# Patient Record
Sex: Female | Born: 1955
Health system: Southern US, Community
[De-identification: ages and names within clinical notes are randomized; demographics above are authoritative.]

## PROBLEM LIST (undated history)

## (undated) DIAGNOSIS — I10 Essential (primary) hypertension: Secondary | ICD-10-CM

## (undated) DIAGNOSIS — Z780 Asymptomatic menopausal state: Secondary | ICD-10-CM

## (undated) DIAGNOSIS — E785 Hyperlipidemia, unspecified: Secondary | ICD-10-CM

## (undated) DIAGNOSIS — M549 Dorsalgia, unspecified: Secondary | ICD-10-CM

## (undated) DIAGNOSIS — M810 Age-related osteoporosis without current pathological fracture: Secondary | ICD-10-CM

## (undated) DIAGNOSIS — F419 Anxiety disorder, unspecified: Secondary | ICD-10-CM

## (undated) HISTORY — DX: Hyperlipidemia, unspecified: E78.5

## (undated) HISTORY — DX: Age-related osteoporosis without current pathological fracture: M81.0

## (undated) HISTORY — DX: Dorsalgia, unspecified: M54.9

## (undated) HISTORY — DX: Asymptomatic menopausal state: Z78.0

## (undated) HISTORY — PX: POLYPECTOMY: SHX149

## (undated) HISTORY — DX: Anxiety disorder, unspecified: F41.9

## (undated) HISTORY — DX: Essential (primary) hypertension: I10

## (undated) HISTORY — PX: COLONOSCOPY: SHX174

---

## 2011-08-03 ENCOUNTER — Encounter: Payer: Self-pay | Admitting: Internal Medicine

## 2011-08-03 ENCOUNTER — Ambulatory Visit (INDEPENDENT_AMBULATORY_CARE_PROVIDER_SITE_OTHER): Payer: BC Managed Care – PPO | Admitting: Internal Medicine

## 2011-08-03 DIAGNOSIS — Z0000 Encounter for general adult medical examination without abnormal findings: Secondary | ICD-10-CM

## 2011-08-03 DIAGNOSIS — Z23 Encounter for immunization: Secondary | ICD-10-CM

## 2011-08-03 DIAGNOSIS — Z1231 Encounter for screening mammogram for malignant neoplasm of breast: Secondary | ICD-10-CM

## 2011-08-03 DIAGNOSIS — F419 Anxiety disorder, unspecified: Secondary | ICD-10-CM

## 2011-08-03 HISTORY — DX: Anxiety disorder, unspecified: F41.9

## 2011-08-03 LAB — CBC WITH DIFFERENTIAL/PLATELET
Basophils Absolute: 0 10*3/uL (ref 0.0–0.1)
Basophils Relative: 0.3 % (ref 0.0–3.0)
Eosinophils Absolute: 0.1 10*3/uL (ref 0.0–0.7)
Eosinophils Relative: 1.4 % (ref 0.0–5.0)
HCT: 32.2 % — ABNORMAL LOW (ref 36.0–46.0)
Hemoglobin: 10.9 g/dL — ABNORMAL LOW (ref 12.0–15.0)
Lymphocytes Relative: 32.5 % (ref 12.0–46.0)
Lymphs Abs: 2.5 10*3/uL (ref 0.7–4.0)
MCHC: 33.8 g/dL (ref 30.0–36.0)
MCV: 98.9 fl (ref 78.0–100.0)
Monocytes Absolute: 0.4 10*3/uL (ref 0.1–1.0)
Monocytes Relative: 5.4 % (ref 3.0–12.0)
Neutro Abs: 4.6 10*3/uL (ref 1.4–7.7)
Neutrophils Relative %: 60.4 % (ref 43.0–77.0)
Platelets: 195 10*3/uL (ref 150.0–400.0)
RBC: 3.26 Mil/uL — ABNORMAL LOW (ref 3.87–5.11)
RDW: 12.6 % (ref 11.5–14.6)
WBC: 7.7 10*3/uL (ref 4.5–10.5)

## 2011-08-03 LAB — LIPID PANEL
Cholesterol: 223 mg/dL — ABNORMAL HIGH (ref 0–200)
HDL: 59.3 mg/dL (ref >39.00)
Total CHOL/HDL Ratio: 4
Triglycerides: 267 mg/dL — ABNORMAL HIGH (ref 0.0–149.0)
VLDL: 53.4 mg/dL — ABNORMAL HIGH (ref 0.0–40.0)

## 2011-08-03 LAB — LDL CHOLESTEROL, DIRECT: Direct LDL: 120.4 mg/dL

## 2011-08-03 LAB — TSH: TSH: 1 u[IU]/mL (ref 0.35–5.50)

## 2011-08-03 MED ORDER — LISINOPRIL-HYDROCHLOROTHIAZIDE 10-12.5 MG PO TABS: 1.0000 | ORAL_TABLET | Freq: Every day | ORAL | Status: DC

## 2011-08-03 NOTE — Assessment & Plan Note (Signed)
Will call for Rf of tranxene when needed , uses on average 1 a day (AM)

## 2011-08-03 NOTE — Patient Instructions (Signed)
Came back for your female check within 6 months

## 2011-08-03 NOTE — Assessment & Plan Note (Addendum)
Previous PCP retired, Dr Javier Glazier (a gynecologist) Last Td >  10 years ago--- give one today Last MMG ~ 5 years ago--benefits explained, referral Last PAP ~ 3 years ago Declined female exam today: will reschedule Tobacco: counseled about risk, info provided about quitting , does see a dentist routinely Never had a cscope: Discussed colon ca screening, iFOB provided, benefits of Cscope discussed will call when ready Recommend a DEXA ("will think about that") Diet-exercise discussed

## 2011-08-03 NOTE — Progress Notes (Signed)
  Subjective:    Patient ID: Kimberly Valenzuela, female    DOB: October 24, 1956, 55 y.o.   MRN: [id number]  HPI New patient, CPX, feels well   Past Medical History  Diagnosis Date  . Hypertension   . Dyslipidemia     high TG , dx ~ 01-2011  . Menopause     after BCP were d/c ~ 2010  . Anxiety 08/03/2011   Past Surgical History  Procedure Date  . Cesarean section    History   Social History  . Marital Status: Married    Spouse Name: N/A    Number of Children: 1  . Years of Education: N/A   Occupational History  . sales     Social History Main Topics  . Smoking status: Current Everyday Smoker -- 0.3 packs/day  . Smokeless tobacco: Never Used  . Alcohol Use: Yes     socially   . Drug Use: No  . Sexually Active: Not on file   Other Topics Concern  . Not on file   Social History Narrative  . No narrative on file   Family History  Problem Relation Age of Onset  . Diabetes Neg Hx   . Coronary artery disease Neg Hx   . Stroke Neg Hx   . Colon cancer Neg Hx   . Breast cancer Neg Hx       Review of Systems  Respiratory: Negative for cough and wheezing.   Cardiovascular: Negative for chest pain and leg swelling.  Gastrointestinal: Negative for abdominal pain and blood in stool.  Genitourinary: Negative for hematuria, vaginal bleeding and difficulty urinating.       Objective:   Physical Exam  Constitutional: She is oriented to person, place, and time. She appears well-developed and well-nourished. No distress.  HENT:  Head: Normocephalic and atraumatic.  Neck: No thyromegaly present.  Cardiovascular: Normal rate, regular rhythm and normal heart sounds.   No murmur heard. Pulmonary/Chest: Effort normal and breath sounds normal. No respiratory distress. She has no wheezes. She has no rales.  Abdominal: Soft. Bowel sounds are normal. She exhibits no distension. There is no tenderness. There is no rebound and no guarding.  Musculoskeletal: She exhibits no edema.    Neurological: She is alert and oriented to person, place, and time.  Skin: Skin is warm and dry. She is not diaphoretic.  Psychiatric: She has a normal mood and affect. Her behavior is normal. Judgment and thought content normal.          Assessment & Plan:

## 2011-08-04 LAB — VITAMIN D 25 HYDROXY (VIT D DEFICIENCY, FRACTURES): Vit D, 25-Hydroxy: 50 ng/mL (ref 30–89)

## 2011-08-06 LAB — COMPREHENSIVE METABOLIC PANEL
ALT: 32 U/L (ref 0–35)
AST: 24 U/L (ref 0–37)
Albumin: 4.7 g/dL (ref 3.5–5.2)
Alkaline Phosphatase: 73 U/L (ref 39–117)
BUN: 21 mg/dL (ref 6–23)
CO2: 25 mEq/L (ref 19–32)
Calcium: 10 mg/dL (ref 8.4–10.5)
Chloride: 103 mEq/L (ref 96–112)
Creatinine, Ser: 1.2 mg/dL (ref 0.4–1.2)
GFR: 50.05 mL/min — ABNORMAL LOW (ref >60.00)
Glucose, Bld: 90 mg/dL (ref 70–99)
Potassium: 4.2 mEq/L (ref 3.5–5.1)
Sodium: 139 mEq/L (ref 135–145)
Total Bilirubin: 0.3 mg/dL (ref 0.3–1.2)
Total Protein: 7.4 g/dL (ref 6.0–8.3)

## 2011-08-07 NOTE — Progress Notes (Signed)
Quick Note:  Pt is aware. Pt states she will call GI herself. ______

## 2011-08-13 ENCOUNTER — Encounter: Payer: Self-pay | Admitting: *Deleted

## 2011-08-16 ENCOUNTER — Other Ambulatory Visit: Payer: Self-pay | Admitting: Internal Medicine

## 2011-08-16 ENCOUNTER — Other Ambulatory Visit: Payer: BC Managed Care – PPO

## 2011-08-16 DIAGNOSIS — Z0000 Encounter for general adult medical examination without abnormal findings: Secondary | ICD-10-CM

## 2011-08-16 LAB — FECAL OCCULT BLOOD, IMMUNOCHEMICAL: Fecal Occult Bld: NEGATIVE

## 2011-08-28 ENCOUNTER — Ambulatory Visit: Payer: BC Managed Care – PPO

## 2011-08-28 DIAGNOSIS — Z029 Encounter for administrative examinations, unspecified: Secondary | ICD-10-CM

## 2011-08-29 ENCOUNTER — Telehealth: Payer: Self-pay | Admitting: *Deleted

## 2011-08-29 NOTE — Telephone Encounter (Signed)
Called patient to inform that Wellness Program form had been faxed; phone states "all circuits busy at this time, please try your call again at a later time".

## 2012-01-09 ENCOUNTER — Telehealth: Payer: Self-pay | Admitting: Internal Medicine

## 2012-01-09 MED ORDER — CLORAZEPATE DIPOTASSIUM 7.5 MG PO TABS: 7.5000 mg | ORAL_TABLET | Freq: Three times a day (TID) | ORAL | Status: DC | PRN

## 2012-01-09 NOTE — Telephone Encounter (Signed)
Refill done.  

## 2012-01-09 NOTE — Telephone Encounter (Signed)
Refill: Clorazepate Dip 7.5. 1 q 6-8 hours.

## 2012-01-09 NOTE — Telephone Encounter (Signed)
Ok 90, no RF 

## 2012-01-09 NOTE — Telephone Encounter (Signed)
OK to refill

## 2012-03-10 ENCOUNTER — Ambulatory Visit (INDEPENDENT_AMBULATORY_CARE_PROVIDER_SITE_OTHER): Payer: BC Managed Care – PPO | Admitting: Internal Medicine

## 2012-03-10 ENCOUNTER — Encounter: Payer: Self-pay | Admitting: Internal Medicine

## 2012-03-10 ENCOUNTER — Other Ambulatory Visit (HOSPITAL_COMMUNITY)
Admission: RE | Admit: 2012-03-10 | Discharge: 2012-03-10 | Disposition: A | Discharge status: Home / Self Care | Payer: BC Managed Care – PPO | Source: Ambulatory Visit | Attending: Internal Medicine | Admitting: Internal Medicine

## 2012-03-10 VITALS — BP 90/62 | HR 80 | Temp 98.1°F | Wt 123.0 lb

## 2012-03-10 DIAGNOSIS — Z01419 Encounter for gynecological examination (general) (routine) without abnormal findings: Secondary | ICD-10-CM | POA: Insufficient documentation

## 2012-03-10 DIAGNOSIS — E785 Hyperlipidemia, unspecified: Secondary | ICD-10-CM | POA: Insufficient documentation

## 2012-03-10 DIAGNOSIS — Z0000 Encounter for general adult medical examination without abnormal findings: Secondary | ICD-10-CM

## 2012-03-10 DIAGNOSIS — Z8041 Family history of malignant neoplasm of ovary: Secondary | ICD-10-CM

## 2012-03-10 DIAGNOSIS — I10 Essential (primary) hypertension: Secondary | ICD-10-CM | POA: Insufficient documentation

## 2012-03-10 DIAGNOSIS — D649 Anemia, unspecified: Secondary | ICD-10-CM | POA: Insufficient documentation

## 2012-03-10 LAB — IRON: Iron: 71 ug/dL (ref 42–145)

## 2012-03-10 LAB — LIPID PANEL
Cholesterol: 184 mg/dL (ref 0–200)
HDL: 52.5 mg/dL (ref >39.00)
LDL Cholesterol: 93 mg/dL (ref 0–99)
Total CHOL/HDL Ratio: 4
Triglycerides: 191 mg/dL — ABNORMAL HIGH (ref 0.0–149.0)
VLDL: 38.2 mg/dL (ref 0.0–40.0)

## 2012-03-10 LAB — HEMOGLOBIN: Hemoglobin: 11.5 g/dL — ABNORMAL LOW (ref 12.0–15.0)

## 2012-03-10 MED ORDER — CLORAZEPATE DIPOTASSIUM 7.5 MG PO TABS: 7.5000 mg | ORAL_TABLET | Freq: Three times a day (TID) | ORAL | Status: DC | PRN

## 2012-03-10 NOTE — Assessment & Plan Note (Addendum)
Last MMG ~ 5 years ago--- MMG ordered today Patient is on her menopause, she took birth control pills up until age 56 Last PAP ~ 3 years ago, PAP sent today Never had a cscope--- Hemoccult positive today. Will send to GI. Patient has a family history of ovarian cancer, grandmother and mother. Will refer to genetic counseling at the oncology center. C  125? Patient concerned about the cost. Recommend to discuss with her insurance.

## 2012-03-10 NOTE — Assessment & Plan Note (Signed)
Repeat a cholesterol panel.

## 2012-03-10 NOTE — Assessment & Plan Note (Signed)
BP today slightly low, she is asymptomatic, no ambulatory BPs. Plan: Check ambulatory BPs, see instructions

## 2012-03-10 NOTE — Progress Notes (Signed)
  Subjective:    Patient ID: Kimberly Valenzuela, female    DOB: 1956/04/19, 56 y.o.   MRN: [id number]  HPI ROV, needs a PAP Today, the patient reports that both her grandmother and mother had been diagnosed with ovarian cancer. Patient is concerned  Past Medical History  Diagnosis Date  . Hypertension   . Dyslipidemia     high TG , dx ~ 01-2011  . Menopause     after BCP were d/c ~ 2010  . Anxiety 08/03/2011   Past Surgical History  Procedure Date  . Cesarean section        Review of Systems Good medication compliance, no ambulatory BPs. BP today 90/62, she is asymptomatic. She noted some weight loss lately, thinks related to worries, her mother is ill. She is taking care of her stepfather. Denies any vaginal discharge or vaginal bleeding. Does self breast exam rarely.     Objective:   Physical Exam General -- alert, well-developed, and well-nourished.   Abdomen--soft, non-tender, no distention, no masses, no HSM, no guarding, and no rigidity.   Extremities-- no pretibial edema bilaterally Rectal-- external hemorrhoids noted. Normal sphincter tone. No rectal masses or tenderness. Brown stool, Hemoccult + Vaginal exam-- normal external exam, normal cervix w/o lesions or d/c, bimanual w/o mass Neurologic-- alert & oriented X3 and strength normal in all extremities. Psych-- Cognition and judgment appear intact. Alert and cooperative with normal attention span and concentration.  not anxious appearing and not depressed appearing.       Assessment & Plan:  Today , I spent more than 25 min with the patient, >50% of the time counseling

## 2012-03-10 NOTE — Patient Instructions (Signed)
I will refer you to a genetic counselor and a hematology oncology group The test that you potentially need is a CA 125. Check the  blood pressure 2 or 3 times a week, be sure it is less than 140/85 and more than 105/65. If it is consistently higher or lower , let me know

## 2012-03-11 LAB — FERRITIN: Ferritin: 38.5 ng/mL (ref 10.0–291.0)

## 2012-03-11 LAB — VITAMIN B12: Vitamin B-12: 511 pg/mL (ref 211–911)

## 2012-03-11 LAB — FOLATE: Folate: 16.2 ng/mL (ref >5.9)

## 2012-03-12 ENCOUNTER — Encounter: Payer: Self-pay | Admitting: Gastroenterology

## 2012-03-13 ENCOUNTER — Encounter: Payer: Self-pay | Admitting: *Deleted

## 2012-03-14 ENCOUNTER — Telehealth: Payer: Self-pay | Admitting: Genetic Counselor

## 2012-03-14 NOTE — Telephone Encounter (Signed)
S/w pt today re genetics appt. Per pt she does not wish to schedule right. Per pt she has a lot on her plate right now and since her test have come back fine she will hold off for now and call us or dr Drue Novel if she wants to schedule appt. Referral from dr Drue Novel.

## 2012-03-21 ENCOUNTER — Ambulatory Visit
Admission: RE | Admit: 2012-03-21 | Discharge: 2012-03-21 | Disposition: A | Discharge status: Home / Self Care | Payer: BC Managed Care – PPO | Source: Ambulatory Visit | Attending: Internal Medicine | Admitting: Internal Medicine

## 2012-03-21 DIAGNOSIS — Z1231 Encounter for screening mammogram for malignant neoplasm of breast: Secondary | ICD-10-CM

## 2012-03-25 ENCOUNTER — Other Ambulatory Visit: Payer: Self-pay | Admitting: Internal Medicine

## 2012-03-25 DIAGNOSIS — R928 Other abnormal and inconclusive findings on diagnostic imaging of breast: Secondary | ICD-10-CM

## 2012-03-26 ENCOUNTER — Telehealth: Payer: Self-pay | Admitting: Genetic Counselor

## 2012-03-26 NOTE — Telephone Encounter (Signed)
lmonvm for pt re calling me for genetics appt. 

## 2012-04-01 ENCOUNTER — Ambulatory Visit
Admission: RE | Admit: 2012-04-01 | Discharge: 2012-04-01 | Disposition: A | Discharge status: Home / Self Care | Payer: BC Managed Care – PPO | Source: Ambulatory Visit | Attending: Internal Medicine | Admitting: Internal Medicine

## 2012-04-01 DIAGNOSIS — R928 Other abnormal and inconclusive findings on diagnostic imaging of breast: Secondary | ICD-10-CM

## 2012-04-08 ENCOUNTER — Ambulatory Visit (AMBULATORY_SURGERY_CENTER): Payer: BC Managed Care – PPO | Admitting: *Deleted

## 2012-04-08 VITALS — Ht 63.0 in | Wt 120.0 lb

## 2012-04-08 DIAGNOSIS — Z1211 Encounter for screening for malignant neoplasm of colon: Secondary | ICD-10-CM

## 2012-04-08 MED ORDER — PEG-KCL-NACL-NASULF-NA ASC-C 100 G PO SOLR: ORAL | Status: DC

## 2012-04-09 ENCOUNTER — Encounter: Payer: Self-pay | Admitting: Internal Medicine

## 2012-04-22 ENCOUNTER — Telehealth: Payer: Self-pay | Admitting: Genetic Counselor

## 2012-04-22 NOTE — Telephone Encounter (Signed)
Per staff message from [facility]) cx referral for genetics.

## 2012-04-23 ENCOUNTER — Other Ambulatory Visit: Payer: BC Managed Care – PPO | Admitting: Gastroenterology

## 2012-04-25 ENCOUNTER — Encounter: Payer: BC Managed Care – PPO | Admitting: Gastroenterology

## 2012-05-16 ENCOUNTER — Encounter: Payer: Self-pay | Admitting: Internal Medicine

## 2012-05-16 ENCOUNTER — Ambulatory Visit (AMBULATORY_SURGERY_CENTER): Payer: BC Managed Care – PPO | Admitting: Internal Medicine

## 2012-05-16 VITALS — BP 88/61 | HR 66 | Temp 99.3°F | Resp 11 | Ht 63.0 in | Wt 120.0 lb

## 2012-05-16 DIAGNOSIS — D126 Benign neoplasm of colon, unspecified: Secondary | ICD-10-CM

## 2012-05-16 DIAGNOSIS — Z1211 Encounter for screening for malignant neoplasm of colon: Secondary | ICD-10-CM

## 2012-05-16 MED ORDER — SODIUM CHLORIDE 0.9 % IV SOLN: 500.0000 mL | INTRAVENOUS | Status: DC

## 2012-05-16 NOTE — Op Note (Signed)
 [facility] [address] [place], [place]  [address]  COLONOSCOPY PROCEDURE REPORT  PATIENT:  Kimberly Valenzuela, Kimberly Valenzuela  MR#:  [id number] BIRTHDATE:  [birth year], 55 yrs. old  GENDER:  female ENDOSCOPIST:  Iva Boop, MD, [facility] REF. BY:  Willow Ora, M.D. PROCEDURE DATE:  05/16/2012 PROCEDURE:  Colonoscopy with snare polypectomy ASA CLASS:  Class II INDICATIONS:  Routine Risk Screening MEDICATIONS:   MAC sedation, administered by CRNA, propofol (Diprivan) 200 mg IV  DESCRIPTION OF PROCEDURE:   After the risks benefits and alternatives of the procedure were thoroughly explained, informed consent was obtained.  Digital rectal exam was performed and revealed no abnormalities.   The LB CF-[id number] endoscope was introduced through the anus and advanced to the cecum, which was identified by both the appendix and ileocecal valve, without limitations.  The quality of the prep was excellent, using MoviPrep.  The instrument was then slowly withdrawn as the colon was fully examined. <<PROCEDUREIMAGES>>  FINDINGS:  A diminutive polyp was found in the sigmoid colon. Polyp was snared without cautery. Retrieval was successful. This was otherwise a normal examination of the colon. Includes right colon retroflexion.   Retroflexed views in the rectum revealed no abnormalities.    The time to cecum = 2:43 minutes. The scope was then carefully withdrawn in 7:49 minutes from the cecum and the procedure completed. COMPLICATIONS:  None ENDOSCOPIC IMPRESSION: 1) Diminutive polyp in the sigmoid colon - removed 2) Otherwise normal examination  REPEAT EXAM:  In for Colonoscopy, pending biopsy results.  Iva Boop, MD, [facility]  CC:  The Patient and Willow Ora, MD  n. eSIGNED:   Iva Boop at 05/16/2012 12:10 PM  [place], [place], [phone]

## 2012-05-16 NOTE — Progress Notes (Signed)
Patient did not experience any of the following events: a burn prior to discharge; a fall within the facility; wrong site/side/patient/procedure/implant event; or a hospital transfer or hospital admission upon discharge from the facility. (G8907) Patient did not have preoperative order for IV antibiotic SSI prophylaxis. (G8918)  

## 2012-05-16 NOTE — Patient Instructions (Addendum)
One small polyp was removed during this screening colonoscopy. It looks benign. I will send you a letter about the results and recommendations for future screening. Iva Boop, MD, FACG  YOU HAD AN ENDOSCOPIC PROCEDURE TODAY AT THE [facility]: Refer to the procedure report that was given to you for any specific questions about what was found during the examination.  If the procedure report does not answer your questions, please call your gastroenterologist to clarify.  If you requested that your care partner not be given the details of your procedure findings, then the procedure report has been included in a sealed envelope for you to review at your convenience later.  YOU SHOULD EXPECT: Some feelings of bloating in the abdomen. Passage of more gas than usual.  Walking can help get rid of the air that was put into your GI tract during the procedure and reduce the bloating. If you had a lower endoscopy (such as a colonoscopy or flexible sigmoidoscopy) you may notice spotting of blood in your stool or on the toilet paper. If you underwent a bowel prep for your procedure, then you may not have a normal bowel movement for a few days.  DIET: Your first meal following the procedure should be a light meal and then it is ok to progress to your normal diet.  A half-sandwich or bowl of soup is an example of a good first meal.  Heavy or fried foods are harder to digest and may make you feel nauseous or bloated.  Likewise meals heavy in dairy and vegetables can cause extra gas to form and this can also increase the bloating.  Drink plenty of fluids but you should avoid alcoholic beverages for 24 hours.  ACTIVITY: Your care partner should take you home directly after the procedure.  You should plan to take it easy, moving slowly for the rest of the day.  You can resume normal activity the day after the procedure however you should NOT DRIVE or use heavy machinery for 24 hours (because of the sedation  medicines used during the test).    SYMPTOMS TO REPORT IMMEDIATELY: A gastroenterologist can be reached at any hour.  During normal business hours, 8:30 AM to 5:00 PM Monday through Friday, call [phone].  After hours and on weekends, please call the GI answering service at [phone] who will take a message and have the physician on call contact you.   Following lower endoscopy (colonoscopy or flexible sigmoidoscopy):  Excessive amounts of blood in the stool  Significant tenderness or worsening of abdominal pains  Swelling of the abdomen that is new, acute  Fever of 100F or higher  Following upper endoscopy (EGD)  Vomiting of blood or coffee ground material  New chest pain or pain under the shoulder blades  Painful or persistently difficult swallowing  New shortness of breath  Fever of 100F or higher  Black, tarry-looking stools  FOLLOW UP: If any biopsies were taken you will be contacted by phone or by letter within the next 1-3 weeks.  Call your gastroenterologist if you have not heard about the biopsies in 3 weeks.  Our staff will call the home number listed on your records the next business day following your procedure to check on you and address any questions or concerns that you may have at that time regarding the information given to you following your procedure. This is a courtesy call and so if there is no answer at the home number and  we have not heard from you through the emergency physician on call, we will assume that you have returned to your regular daily activities without incident.  SIGNATURES/CONFIDENTIALITY: You and/or your care partner have signed paperwork which will be entered into your electronic medical record.  These signatures attest to the fact that that the information above on your After Visit Summary has been reviewed and is understood.  Full responsibility of the confidentiality of this discharge information lies with you and/or your care-partner.     Handout on polyps

## 2012-05-19 ENCOUNTER — Telehealth: Payer: Self-pay | Admitting: *Deleted

## 2012-05-19 NOTE — Telephone Encounter (Signed)
  Follow up Call-  Call back number 05/16/2012  Post procedure Call Back phone  # [phone]  Permission to leave phone message Yes     Patient questions:  Do you have a fever, pain , or abdominal swelling? no Pain Score  0 *  Have you tolerated food without any problems? yes  Have you been able to return to your normal activities? yes  Do you have any questions about your discharge instructions: Diet   no Medications  no Follow up visit  no  Do you have questions or concerns about your Care? no  Actions: * If pain score is 4 or above: No action needed, pain <4.

## 2012-05-21 ENCOUNTER — Encounter: Payer: Self-pay | Admitting: Internal Medicine

## 2012-05-21 DIAGNOSIS — Z860100 Personal history of colon polyps, unspecified: Secondary | ICD-10-CM | POA: Insufficient documentation

## 2012-05-21 NOTE — Progress Notes (Signed)
Quick Note:  Diminutive sigmoid sessile serrated adenoma - routine repeat 5 years about 2018 ______

## 2012-06-09 ENCOUNTER — Ambulatory Visit: Payer: BC Managed Care – PPO | Admitting: Internal Medicine

## 2012-08-22 ENCOUNTER — Ambulatory Visit (INDEPENDENT_AMBULATORY_CARE_PROVIDER_SITE_OTHER): Payer: BC Managed Care – PPO | Admitting: Internal Medicine

## 2012-08-22 ENCOUNTER — Encounter: Payer: Self-pay | Admitting: Internal Medicine

## 2012-08-22 VITALS — BP 112/74 | HR 77 | Temp 98.2°F | Ht 62.5 in | Wt 117.0 lb

## 2012-08-22 DIAGNOSIS — I10 Essential (primary) hypertension: Secondary | ICD-10-CM

## 2012-08-22 DIAGNOSIS — F419 Anxiety disorder, unspecified: Secondary | ICD-10-CM

## 2012-08-22 DIAGNOSIS — E785 Hyperlipidemia, unspecified: Secondary | ICD-10-CM

## 2012-08-22 DIAGNOSIS — Z780 Asymptomatic menopausal state: Secondary | ICD-10-CM

## 2012-08-22 DIAGNOSIS — Z0000 Encounter for general adult medical examination without abnormal findings: Secondary | ICD-10-CM

## 2012-08-22 LAB — COMPREHENSIVE METABOLIC PANEL
ALT: 35 U/L (ref 0–35)
AST: 32 U/L (ref 0–37)
Albumin: 4.2 g/dL (ref 3.5–5.2)
Alkaline Phosphatase: 75 U/L (ref 39–117)
BUN: 23 mg/dL (ref 6–23)
CO2: 25 mEq/L (ref 19–32)
Calcium: 9.3 mg/dL (ref 8.4–10.5)
Chloride: 107 mEq/L (ref 96–112)
Creatinine, Ser: 1.1 mg/dL (ref 0.4–1.2)
GFR: 52.39 mL/min — ABNORMAL LOW (ref >60.00)
Glucose, Bld: 93 mg/dL (ref 70–99)
Potassium: 4.2 mEq/L (ref 3.5–5.1)
Sodium: 138 mEq/L (ref 135–145)
Total Bilirubin: 0.5 mg/dL (ref 0.3–1.2)
Total Protein: 6.7 g/dL (ref 6.0–8.3)

## 2012-08-22 LAB — CBC WITH DIFFERENTIAL/PLATELET
Basophils Absolute: 0 10*3/uL (ref 0.0–0.1)
Basophils Relative: 0.4 % (ref 0.0–3.0)
Eosinophils Absolute: 0.1 10*3/uL (ref 0.0–0.7)
Eosinophils Relative: 2 % (ref 0.0–5.0)
HCT: 33.3 % — ABNORMAL LOW (ref 36.0–46.0)
Hemoglobin: 11.3 g/dL — ABNORMAL LOW (ref 12.0–15.0)
Lymphocytes Relative: 29.4 % (ref 12.0–46.0)
Lymphs Abs: 1.9 10*3/uL (ref 0.7–4.0)
MCHC: 33.9 g/dL (ref 30.0–36.0)
MCV: 97.8 fl (ref 78.0–100.0)
Monocytes Absolute: 0.3 10*3/uL (ref 0.1–1.0)
Monocytes Relative: 4.4 % (ref 3.0–12.0)
Neutro Abs: 4.2 10*3/uL (ref 1.4–7.7)
Neutrophils Relative %: 63.8 % (ref 43.0–77.0)
Platelets: 234 10*3/uL (ref 150.0–400.0)
RBC: 3.41 Mil/uL — ABNORMAL LOW (ref 3.87–5.11)
RDW: 12.4 % (ref 11.5–14.6)
WBC: 6.5 10*3/uL (ref 4.5–10.5)

## 2012-08-22 LAB — FERRITIN: Ferritin: 50.8 ng/mL (ref 10.0–291.0)

## 2012-08-22 LAB — LIPID PANEL
Cholesterol: 165 mg/dL (ref 0–200)
HDL: 61.2 mg/dL (ref >39.00)
LDL Cholesterol: 83 mg/dL (ref 0–99)
Total CHOL/HDL Ratio: 3
Triglycerides: 102 mg/dL (ref 0.0–149.0)
VLDL: 20.4 mg/dL (ref 0.0–40.0)

## 2012-08-22 LAB — IRON: Iron: 76 ug/dL (ref 42–145)

## 2012-08-22 LAB — VITAMIN B12: Vitamin B-12: 340 pg/mL (ref 211–911)

## 2012-08-22 MED ORDER — LISINOPRIL-HYDROCHLOROTHIAZIDE 10-12.5 MG PO TABS: 1.0000 | ORAL_TABLET | Freq: Every day | ORAL | Status: DC

## 2012-08-22 MED ORDER — CLORAZEPATE DIPOTASSIUM 7.5 MG PO TABS: 7.5000 mg | ORAL_TABLET | Freq: Three times a day (TID) | ORAL | Status: DC | PRN

## 2012-08-22 NOTE — Assessment & Plan Note (Addendum)
Previous PCP retired, Dr Javier Glazier (a gynecologist) Last Td 2012 declined a flu shot , benefits explained  MMG 9-12 abnormal f/u by a normal R   u/s 03-2012 which was normal, they recommended a screening MMG 03-2013. Encouraged SBE Last PAP 02-2012, h/o , of a remote, single abnormal PAP, multiple normal paps since. Plan-- pap next year Family history of ovarian ca x 2, pt decided not to pursue genetic counseling Cscope Dr Leone Payor [phone], had a polyp, next in 5 years Tobacco: counseled about risk, does see a dentist routinely, E cigaret ? I'm not opposed Recommend a DEXA , will schedule, rec ca and vit d Diet-exercise discussed

## 2012-08-22 NOTE — Assessment & Plan Note (Signed)
Lost her father recently, mother has cancer, she has some anxiety but managing well with clorazepate one in the morning and occasionally an additional one in the afternoon.

## 2012-08-22 NOTE — Assessment & Plan Note (Signed)
good medication compliance, BP one time was 87/47 she did not feel particularly weak. I wonder if that was accurate. Her readings are within normal otherwise. Plan: No change

## 2012-08-22 NOTE — Progress Notes (Signed)
  Subjective:    Patient ID: Kimberly Valenzuela, female    DOB: [birth year], 56 y.o.   MRN: [id number]  HPI CPX  Past medical history Hypertension Anxiety Dyslipidemia, hypertriglyceridemia DX 01-2011 Menopause, after BCP  were discontinued 2010  Past surgical history C-section  History   Social History  . Marital Status: Married    Spouse Name: N/A    Number of Children: 1  . Years of Education: N/A   Occupational History  . sales     Social History Main Topics  . Smoking status: Current Every Day Smoker -- 0.5 packs/day    Types: Cigarettes  . Smokeless tobacco: Never Used  . Alcohol Use: 0.0 oz/week     socially , wine  . Drug Use: 2 per week    Special: Marijuana  . Sexually Active: Not on file   Other Topics Concern  . Not on file   Social History Narrative  . No narrative on file   Family History  Problem Relation Age of Onset  . Diabetes Neg Hx   . Coronary artery disease Neg Hx   . Stroke Neg Hx   . Breast cancer Neg Hx   . Ovarian cancer Mother   . Ovarian cancer Maternal Grandmother   . Colon cancer Maternal Grandfather     Review of Systems In general doing well, see assessment and plan Denies chest pain, shortness of breath. Very seldom has cough. No nausea, vomiting, diarrhea, no blood in the stools. Denies any vaginal discharge or vaginal bleeding.     Objective:   Physical Exam General -- alert, well-developed, and well-nourished.   Neck --no thyromegaly , normal carotid pulse Lungs -- normal respiratory effort, no intercostal retractions, no accessory muscle use, and normal breath sounds.   Heart-- normal rate, regular rhythm, no murmur, and no gallop.   Abdomen--soft, non-tender, no distention, no masses, no HSM, no guarding, and no rigidity.   Extremities-- no pretibial edema bilaterally  Neurologic-- alert & oriented X3 and strength normal in all extremities. Psych-- Cognition and judgment appear intact. Alert and cooperative with normal  attention span and concentration.  not anxious appearing and not depressed appearing.       Assessment & Plan:

## 2012-08-22 NOTE — Assessment & Plan Note (Signed)
Not taking fish oil daily. Recommend to take at least 1 a day

## 2012-08-26 ENCOUNTER — Encounter: Payer: Self-pay | Admitting: *Deleted

## 2012-08-26 NOTE — Addendum Note (Signed)
Addended by: Edwena Felty T on: 08/26/2012 03:05 PM   Modules accepted: Orders

## 2012-08-27 ENCOUNTER — Telehealth: Payer: Self-pay

## 2012-08-27 NOTE — Telephone Encounter (Signed)
Spoke with pt advising lab results:  Kimberly Valenzuela, Your calcium, potassium, kidney, liver tests and B12 are normal Your cholesterol is very good You have a very mild anemia but the iron is normal. Good results, continue with use the same medications.   Also advised pt [name] mailed the result. Pt stated understanding.      MW

## 2012-10-03 ENCOUNTER — Ambulatory Visit (INDEPENDENT_AMBULATORY_CARE_PROVIDER_SITE_OTHER)
Admission: RE | Admit: 2012-10-03 | Discharge: 2012-10-03 | Disposition: A | Discharge status: Home / Self Care | Payer: BC Managed Care – PPO | Source: Ambulatory Visit | Attending: Internal Medicine | Admitting: Internal Medicine

## 2012-10-03 DIAGNOSIS — Z780 Asymptomatic menopausal state: Secondary | ICD-10-CM

## 2012-10-12 ENCOUNTER — Telehealth: Payer: Self-pay | Admitting: Internal Medicine

## 2012-10-12 NOTE — Telephone Encounter (Signed)
Advise patient, DEXA--> osteoporosis. In addition to daily calcium, vitamin D and stay active I recommend a medication. She could try Fosamax weekly or Actonel monthly. Let me know her preference. We can discuss further at an office visit if needed

## 2012-10-14 NOTE — Telephone Encounter (Signed)
Called pt, unable to leave a msg.   

## 2012-10-17 ENCOUNTER — Encounter: Payer: Self-pay | Admitting: *Deleted

## 2012-10-17 NOTE — Telephone Encounter (Signed)
Called pt, unable to leave a msg. Mailed letter.

## 2012-10-22 ENCOUNTER — Telehealth: Payer: Self-pay | Admitting: Internal Medicine

## 2012-10-22 NOTE — Telephone Encounter (Signed)
Pt would like to know if there is something else she can take other than fosamax & actonel. Please advise.

## 2012-10-22 NOTE — Telephone Encounter (Signed)
Yes, there is a number of other medicines she could take. Please arrange an office visit to discuss

## 2012-10-22 NOTE — Telephone Encounter (Signed)
Discussed with pt. Scheduled appt 12.16.13 @ 345p.

## 2012-10-22 NOTE — Telephone Encounter (Signed)
Patient received bmd results and would like to know if there is another alternative to the medications listed. Please call pt back at [phone]

## 2012-11-03 ENCOUNTER — Ambulatory Visit (INDEPENDENT_AMBULATORY_CARE_PROVIDER_SITE_OTHER): Payer: BC Managed Care – PPO | Admitting: Internal Medicine

## 2012-11-03 VITALS — BP 104/68 | HR 64 | Temp 98.0°F | Wt 124.0 lb

## 2012-11-03 DIAGNOSIS — Z0000 Encounter for general adult medical examination without abnormal findings: Secondary | ICD-10-CM

## 2012-11-13 NOTE — Progress Notes (Signed)
  Subjective:    Patient ID: Kimberly Valenzuela, female    DOB: 07-11-1956, 56 y.o.   MRN: [id number]  HPI Left without being seen, we were running late, stated she will reschedule   Review of Systems     Objective:   Physical Exam        Assessment & Plan:

## 2013-02-11 ENCOUNTER — Telehealth: Payer: Self-pay | Admitting: Internal Medicine

## 2013-02-12 NOTE — Telephone Encounter (Signed)
Left detailed msg on pt's vmail to return call & schedule OV.

## 2013-02-12 NOTE — Telephone Encounter (Signed)
Call pt, let her know I refilled #90, arrange a OV please

## 2013-02-12 NOTE — Telephone Encounter (Signed)
Ok to refill? Last OV 12.16.13 Last filled 10.4.13

## 2013-04-29 ENCOUNTER — Telehealth: Payer: Self-pay | Admitting: Internal Medicine

## 2013-04-29 NOTE — Telephone Encounter (Signed)
What lab orders need to be entered? Please advise.

## 2013-04-29 NOTE — Telephone Encounter (Signed)
Patient is calling to inquire about if she is due for labs in order to continue her Clorazepate medication.

## 2013-04-30 NOTE — Telephone Encounter (Signed)
She is due for a ROV, please arrange. I don't see a UDS, she needs one

## 2013-04-30 NOTE — Telephone Encounter (Signed)
Spoke with pt, scheduled appt 6.16.14.

## 2013-05-01 ENCOUNTER — Encounter: Payer: Self-pay | Admitting: Lab

## 2013-05-04 ENCOUNTER — Encounter: Payer: Self-pay | Admitting: Internal Medicine

## 2013-05-04 ENCOUNTER — Ambulatory Visit (INDEPENDENT_AMBULATORY_CARE_PROVIDER_SITE_OTHER): Payer: BC Managed Care – PPO | Admitting: Internal Medicine

## 2013-05-04 VITALS — BP 110/74 | HR 68 | Temp 98.3°F | Wt 120.0 lb

## 2013-05-04 DIAGNOSIS — M810 Age-related osteoporosis without current pathological fracture: Secondary | ICD-10-CM | POA: Insufficient documentation

## 2013-05-04 DIAGNOSIS — F419 Anxiety disorder, unspecified: Secondary | ICD-10-CM

## 2013-05-04 DIAGNOSIS — I10 Essential (primary) hypertension: Secondary | ICD-10-CM

## 2013-05-04 DIAGNOSIS — Z0000 Encounter for general adult medical examination without abnormal findings: Secondary | ICD-10-CM

## 2013-05-04 LAB — LIPID PANEL
Cholesterol: 194 mg/dL (ref 0–200)
HDL: 63.8 mg/dL (ref >39.00)
Total CHOL/HDL Ratio: 3
Triglycerides: 221 mg/dL — ABNORMAL HIGH (ref 0.0–149.0)
VLDL: 44.2 mg/dL — ABNORMAL HIGH (ref 0.0–40.0)

## 2013-05-04 LAB — COMPREHENSIVE METABOLIC PANEL
ALT: 44 U/L — ABNORMAL HIGH (ref 0–35)
AST: 35 U/L (ref 0–37)
Albumin: 4.3 g/dL (ref 3.5–5.2)
Alkaline Phosphatase: 63 U/L (ref 39–117)
BUN: 29 mg/dL — ABNORMAL HIGH (ref 6–23)
CO2: 24 mEq/L (ref 19–32)
Calcium: 9.4 mg/dL (ref 8.4–10.5)
Chloride: 106 mEq/L (ref 96–112)
Creatinine, Ser: 1.4 mg/dL — ABNORMAL HIGH (ref 0.4–1.2)
GFR: 43 mL/min — ABNORMAL LOW (ref >60.00)
Glucose, Bld: 81 mg/dL (ref 70–99)
Potassium: 4 mEq/L (ref 3.5–5.1)
Sodium: 140 mEq/L (ref 135–145)
Total Bilirubin: 0.5 mg/dL (ref 0.3–1.2)
Total Protein: 6.9 g/dL (ref 6.0–8.3)

## 2013-05-04 LAB — CBC WITH DIFFERENTIAL/PLATELET
Basophils Absolute: 0 10*3/uL (ref 0.0–0.1)
Basophils Relative: 0.4 % (ref 0.0–3.0)
Eosinophils Absolute: 0.1 10*3/uL (ref 0.0–0.7)
Eosinophils Relative: 1.8 % (ref 0.0–5.0)
HCT: 34.7 % — ABNORMAL LOW (ref 36.0–46.0)
Hemoglobin: 12 g/dL (ref 12.0–15.0)
Lymphocytes Relative: 32.9 % (ref 12.0–46.0)
Lymphs Abs: 2.4 10*3/uL (ref 0.7–4.0)
MCHC: 34.8 g/dL (ref 30.0–36.0)
MCV: 97.9 fl (ref 78.0–100.0)
Monocytes Absolute: 0.4 10*3/uL (ref 0.1–1.0)
Monocytes Relative: 4.9 % (ref 3.0–12.0)
Neutro Abs: 4.4 10*3/uL (ref 1.4–7.7)
Neutrophils Relative %: 60 % (ref 43.0–77.0)
Platelets: 223 10*3/uL (ref 150.0–400.0)
RBC: 3.54 Mil/uL — ABNORMAL LOW (ref 3.87–5.11)
RDW: 13 % (ref 11.5–14.6)
WBC: 7.3 10*3/uL (ref 4.5–10.5)

## 2013-05-04 LAB — LDL CHOLESTEROL, DIRECT: Direct LDL: 99.2 mg/dL

## 2013-05-04 LAB — TSH: TSH: 0.99 u[IU]/mL (ref 0.35–5.50)

## 2013-05-04 MED ORDER — CLORAZEPATE DIPOTASSIUM 7.5 MG PO TABS: ORAL_TABLET | ORAL | Status: DC

## 2013-05-04 NOTE — Assessment & Plan Note (Addendum)
Tdap 2012 Zostavax discussed  Cscope 04-2012--->  Polyps, 5 years  Last MMG 03-2012, abnormal on the R , f/u u/s R was ok, next was recommended in 1 year, will schedule (declined, likes to wait till next year) Breast exam today (-) H/o abnormal PAP x 1 in the 90s, f/u by normal ones, PAP 2013 (-) next ~2015 Patient has a family history of ovarian cancer, grandmother and mother. Will refer to genetic counseling at the oncology center. Diet-exercise discussed

## 2013-05-04 NOTE — Assessment & Plan Note (Addendum)
See ROS, going through a difficult time, counseled briefly today, recommend to see Raynelle Fanning. Continue Tranxene, discussed SSRIs

## 2013-05-04 NOTE — Assessment & Plan Note (Signed)
No change, labs  

## 2013-05-04 NOTE — Progress Notes (Signed)
  Subjective:    Patient ID: Kimberly Valenzuela, female    DOB: May 24, 1956, 57 y.o.   MRN: [id number]  HPI CPX  Past Medical History  Diagnosis Date  . Hypertension   . Dyslipidemia     high TG , dx ~ 01-2011  . Menopause     after BCP were d/c ~ 2010  . Anxiety 08/03/2011   Past Surgical History  Procedure Laterality Date  . Cesarean section     History   Social History  . Marital Status: Married    Spouse Name: N/A    Number of Children: 1  . Years of Education: N/A   Occupational History  . sales     Social History Main Topics  . Smoking status: Current Every Day Smoker -- 0.50 packs/day    Types: Cigarettes  . Smokeless tobacco: Never Used  . Alcohol Use: 0.0 oz/week     Comment: socially , wine  . Drug Use: 2.00 per week    Special: Marijuana  . Sexually Active: Not on file   Other Topics Concern  . Not on file   Social History Narrative   Lost parents 2014   Family History  Problem Relation Age of Onset  . Diabetes Neg Hx   . Coronary artery disease Neg Hx   . Stroke Neg Hx   . Breast cancer Neg Hx   . Ovarian cancer Mother   . Ovarian cancer Maternal Grandmother   . Colon cancer Maternal Grandfather    Review of Systems Diet regular, active but no routine exercise. Had a difficult year, lost both parents, husbaund lost his job, he abuses alcohol. ++Anxiety and mild depression on-off , Tranxene when necessary helps. Self breast exam normal. Denies any blood in the stools. No vaginal discharge or vaginal bleeding.     Objective:   Physical Exam BP 110/74  Pulse 68  Temp(Src) 98.3 F (36.8 C) (Oral)  Wt 120 lb (54.432 kg)  BMI 21.59 kg/m2  SpO2 99%  General -- alert, well-developed, NAD.   Neck --no thyromegaly , normal carotid pulse  Breasts-- quite nodular, no dominant mass; no thickening, tenderness, bulging, retraction, inflamation, nipple discharge or skin changes noted.  no axillary lymph nodes Lungs -- normal respiratory effort, no  intercostal retractions, no accessory muscle use, and normal breath sounds.   Heart-- normal rate, regular rhythm, no murmur, and no gallop.   Abdomen--soft, non-tender, no distention, no masses, no HSM, no guarding, and no rigidity.   Extremities-- no pretibial edema bilaterally  Neurologic-- alert & oriented X3 and strength normal in all extremities. Psych-- Cognition and judgment appear intact. Alert and cooperative with normal attention span and concentration.  not anxious appearing and not depressed appearing.       Assessment & Plan:

## 2013-05-04 NOTE — Assessment & Plan Note (Addendum)
Bone density test -- 2013 show T score of -2.8. His normal vitamin D. Risk factors discussed. In addition to calcium and vitamin D I recommend medication : oral-IV biphosphonates, Forteo, Prolia, HRT, etc. Elected IV, will set up Reclast

## 2013-05-04 NOTE — Patient Instructions (Addendum)
Next visit in 3 months. Please make an appointment with a counselor, consider Raynelle Fanning a counselor in our office.

## 2013-05-08 ENCOUNTER — Telehealth: Payer: Self-pay | Admitting: Internal Medicine

## 2013-05-08 ENCOUNTER — Telehealth: Payer: Self-pay | Admitting: Genetic Counselor

## 2013-05-08 DIAGNOSIS — I10 Essential (primary) hypertension: Secondary | ICD-10-CM

## 2013-05-08 NOTE — Telephone Encounter (Signed)
Discussed with pt, entered lab orders.

## 2013-05-08 NOTE — Telephone Encounter (Signed)
Patient states that she missed a call yesterday in regards to her lab results.

## 2013-05-08 NOTE — Telephone Encounter (Signed)
Message copied by Nada Maclachlan on Fri May 08, 2013  8:59 AM ------      Message from: Willow Ora E      Created: Tue May 05, 2013  6:41 PM       Please call the patient:      Her kidney function is a slightly decreased and  one of the LFTs is slightly elevated. This minor abnormalities in her blood may be due to taking too many Tylenol or Advils and not drinking enough fluids. Also the liver elevation  may be affected by drinking wine daily.      Recommend to drink plenty of fluids, avoid excessive Tylenol or Advil and recheck her blood in one month: BMP, AST, ALT DX hypertension, elevated LFTs.      Other labs are satisfactory. ------

## 2013-05-08 NOTE — Telephone Encounter (Signed)
Patient just spoke with [name] to discuss her liver being elevated. States that she forgot to ask what the number was and what it is supposed to be at. Please advise.

## 2013-05-08 NOTE — Telephone Encounter (Signed)
S/w pt in re Genetic Appt 08/11 @ 2 w/[employer] Welcome packet mailed.

## 2013-05-08 NOTE — Telephone Encounter (Signed)
Discussed with pt

## 2013-06-29 ENCOUNTER — Encounter: Payer: Self-pay | Admitting: Genetic Counselor

## 2013-06-29 ENCOUNTER — Other Ambulatory Visit: Payer: BC Managed Care – PPO | Admitting: Lab

## 2013-06-29 ENCOUNTER — Ambulatory Visit (HOSPITAL_BASED_OUTPATIENT_CLINIC_OR_DEPARTMENT_OTHER): Payer: BC Managed Care – PPO | Admitting: Genetic Counselor

## 2013-06-29 DIAGNOSIS — Z8051 Family history of malignant neoplasm of kidney: Secondary | ICD-10-CM

## 2013-06-29 DIAGNOSIS — Z800 Family history of malignant neoplasm of digestive organs: Secondary | ICD-10-CM

## 2013-06-29 DIAGNOSIS — Z8041 Family history of malignant neoplasm of ovary: Secondary | ICD-10-CM

## 2013-06-29 NOTE — Progress Notes (Signed)
Dr. Willow Ora requested a consultation for genetic counseling and risk assessment for Kimberly Valenzuela, a 57 y.o. female, for discussion of her family history of ovarian, colon and renal cancer.  She presents to clinic today to discuss the possibility of a genetic predisposition to cancer, and to further clarify her risks, as well as her family members' risks for cancer.   HISTORY OF PRESENT ILLNESS: Kimberly Valenzuela is a 57 y.o. female with no personal history of cancer.  She has had a colonoscopy which revealed polyps.  She is unsure if she has been put on an increased recall schedule.  Past Medical History  Diagnosis Date  . Hypertension   . Dyslipidemia     high TG , dx ~ 01-2011  . Menopause     after BCP were d/c ~ 2010  . Anxiety 08/03/2011    Past Surgical History  Procedure Laterality Date  . Cesarean section      History   Social History  . Marital Status: Married    Spouse Name: N/A    Number of Children: 1  . Years of Education: N/A   Occupational History  . sales     Social History Main Topics  . Smoking status: Current Every Day Smoker -- 0.50 packs/day for 34 years    Types: Cigarettes  . Smokeless tobacco: Never Used  . Alcohol Use: 4.2 oz/week    7 Glasses of wine per week     Comment: socially , wine  . Drug Use: 2.00 per week    Special: Marijuana  . Sexually Active: None   Other Topics Concern  . None   Social History Narrative   Lost parents 2014    REPRODUCTIVE HISTORY AND PERSONAL RISK ASSESSMENT FACTORS: Menarche was at age 26-17.   postmenopausal Uterus Intact: yes Ovaries Intact: yes G3P1A2, first live birth at age 73  She has not previously undergone treatment for infertility.   Oral Contraceptive use: 40 years   She has not used HRT in the past.    FAMILY HISTORY:  We obtained a detailed, 4-generation family history.  Significant diagnoses are listed below: Family History  Problem Relation Age of Onset  . Diabetes Neg Hx   . Coronary  artery disease Neg Hx   . Stroke Neg Hx   . Breast cancer Neg Hx   . Ovarian cancer Mother 27  . Colon cancer Maternal Grandmother     dx in her 69s  . Renal cancer Maternal Grandfather     dx in his 24s  . Lung cancer Maternal Uncle     smoker   Patient's ancestors are of unknown descent. There is no reported Ashkenazi Jewish ancestry. There is no known consanguinity.  GENETIC COUNSELING ASSESSMENT: Kimberly Valenzuela is a 57 y.o. female with a family history of ovarian, colon and renal cancer which somewhat suggestive of Lynch syndrome and predisposition to cancer. We, therefore, discussed and recommended the following at today's visit.   DISCUSSION: We reviewed the characteristics, features and inheritance patterns of hereditary cancer syndromes. We also discussed genetic testing, including the appropriate family members to test, the process of testing, insurance coverage and turn-around-time for results. We discussed that approximately 12% of ovarian cancer cases are the result of a hereditary cancer syndrome.  Most commonly, it is the result of BRCA mutations, but can also be the result of mutations within the MMR genes that are associated with Lynch Syndrome.  Based on her family history of colon  and ovarian cancer, it is most suggestive of a colon cancer syndrome.  We recommended Kimberly Valenzuela pursue genetic testing for breast/ovarian cancer panel at [facility], as this will assess the genes associated with hereditary ovarian cancer.   PLAN: After considering the risks, benefits, and limitations, Kimberly Valenzuela provided informed consent to pursue genetic testing and the blood sample will be sent to [facility] for analysis of the Breast/Ovarian cancer panel. We discussed the implications of a positive, negative and/ or variant of uncertain significance genetic test result. Results should be available within approximately 3-4 weeks' time, at which point they will be disclosed by telephone to  Kimberly Valenzuela, as will any additional her recommendations warranted by these results. Kimberly Valenzuela will receive a summary of her genetic counseling visit and a copy of her results once available. This information will also be available in Epic. We encouraged Kimberly Valenzuela to remain in contact with cancer genetics annually so that we can continuously update the family history and inform her of any changes in cancer genetics and testing that may be of benefit for her family. Kimberly Valenzuela's questions were answered to her satisfaction today. Our contact information was provided should additional questions or concerns arise.   Per the patient's request, we will contact her by telephone to discuss these results. A follow up genetic counseling visit will be scheduled if indicated.  The patient was seen for a total of 60 minutes, greater than 50% of which was spent face-to-face counseling.  This plan is being carried out per Dr. Feliz Beam recommendations.  This note will also be sent to the referring provider via the electronic medical record. The patient will be supplied with a summary of this genetic counseling discussion as well as educational information on the discussed hereditary cancer syndromes following the conclusion of their visit.   Patient was discussed with Dr. Drue Second.   _______________________________________________________________________ For Office Staff:  Number of people involved in session: 1 Was an Intern/ student involved with case: no

## 2013-07-07 ENCOUNTER — Telehealth: Payer: Self-pay | Admitting: Internal Medicine

## 2013-07-07 NOTE — Telephone Encounter (Signed)
Due for labs, please arrange: BMP, AST, ALT DX hypertension, elevated LFTs.

## 2013-07-08 ENCOUNTER — Encounter: Payer: Self-pay | Admitting: Internal Medicine

## 2013-07-09 NOTE — Telephone Encounter (Signed)
Pt scheduled for labs Wednesday 07/15/13.

## 2013-07-09 NOTE — Telephone Encounter (Signed)
thx

## 2013-07-10 ENCOUNTER — Telehealth: Payer: Self-pay | Admitting: Genetic Counselor

## 2013-07-10 ENCOUNTER — Encounter: Payer: Self-pay | Admitting: Genetic Counselor

## 2013-07-10 NOTE — Telephone Encounter (Signed)
Revealed negative genetic testing.    

## 2013-07-15 ENCOUNTER — Other Ambulatory Visit (INDEPENDENT_AMBULATORY_CARE_PROVIDER_SITE_OTHER): Payer: BC Managed Care – PPO

## 2013-07-15 DIAGNOSIS — I10 Essential (primary) hypertension: Secondary | ICD-10-CM

## 2013-07-15 LAB — BASIC METABOLIC PANEL
BUN: 30 mg/dL — ABNORMAL HIGH (ref 6–23)
CO2: 26 mEq/L (ref 19–32)
Calcium: 9.5 mg/dL (ref 8.4–10.5)
Chloride: 107 mEq/L (ref 96–112)
Creatinine, Ser: 1.2 mg/dL (ref 0.4–1.2)
GFR: 47.84 mL/min — ABNORMAL LOW (ref >60.00)
Glucose, Bld: 88 mg/dL (ref 70–99)
Potassium: 4.2 mEq/L (ref 3.5–5.1)
Sodium: 139 mEq/L (ref 135–145)

## 2013-07-15 LAB — ALT: ALT: 25 U/L (ref 0–35)

## 2013-07-16 LAB — AST: AST: 26 U/L (ref 0–37)

## 2013-07-17 ENCOUNTER — Encounter: Payer: Self-pay | Admitting: *Deleted

## 2013-08-03 ENCOUNTER — Encounter: Payer: Self-pay | Admitting: Internal Medicine

## 2013-09-10 ENCOUNTER — Other Ambulatory Visit: Payer: Self-pay | Admitting: Internal Medicine

## 2013-09-14 ENCOUNTER — Other Ambulatory Visit: Payer: Self-pay | Admitting: *Deleted

## 2013-09-14 MED ORDER — LISINOPRIL-HYDROCHLOROTHIAZIDE 10-12.5 MG PO TABS: 1.0000 | ORAL_TABLET | Freq: Every day | ORAL | Status: DC

## 2013-09-14 NOTE — Telephone Encounter (Signed)
Medication refilled

## 2013-09-14 NOTE — Telephone Encounter (Signed)
Lisinopril refill sent to pharmacy 

## 2013-09-14 NOTE — Telephone Encounter (Signed)
Patient called and was wondering why we didn't refill her blood pressure medication . Patient states that she only needs about 4 or 5 to hold her over until her apt on Friday. thanks

## 2013-09-17 ENCOUNTER — Telehealth: Payer: Self-pay

## 2013-09-17 NOTE — Telephone Encounter (Signed)
Medication and allergies: reviewed and updated  90 day supply/mail order: na Local pharmacy: [facility] [address]   Immunizations due:  Declines flu vaccine  A/P:   No changes to FH or PSH MMG--03/2012--recommended q year Pap--02/2012--Dr Paz--no abnormal findings---had a false positive many years ago Tdap--07/2011--next 2022 Bone Density--09/2012 [facility]--04/2012--adenomatous polyps--Dr Gesner--next due 07/2017  To Discuss with Provider: Not at this time

## 2013-09-18 ENCOUNTER — Ambulatory Visit (INDEPENDENT_AMBULATORY_CARE_PROVIDER_SITE_OTHER): Payer: BC Managed Care – PPO | Admitting: Internal Medicine

## 2013-09-18 ENCOUNTER — Encounter: Payer: Self-pay | Admitting: Internal Medicine

## 2013-09-18 VITALS — BP 115/75 | HR 86 | Temp 98.6°F | Ht 63.0 in | Wt 127.0 lb

## 2013-09-18 DIAGNOSIS — I10 Essential (primary) hypertension: Secondary | ICD-10-CM

## 2013-09-18 DIAGNOSIS — M810 Age-related osteoporosis without current pathological fracture: Secondary | ICD-10-CM

## 2013-09-18 DIAGNOSIS — F411 Generalized anxiety disorder: Secondary | ICD-10-CM

## 2013-09-18 DIAGNOSIS — F419 Anxiety disorder, unspecified: Secondary | ICD-10-CM

## 2013-09-18 MED ORDER — CLORAZEPATE DIPOTASSIUM 7.5 MG PO TABS: ORAL_TABLET | ORAL | Status: DC

## 2013-09-18 MED ORDER — IBANDRONATE SODIUM 150 MG PO TABS: 150.0000 mg | ORAL_TABLET | ORAL | Status: DC

## 2013-09-18 NOTE — Assessment & Plan Note (Signed)
Symptoms well-controlled with tranxene usually takes one or 2 tablets a day. Admits to occasional marijuana use. We'll get a UDS, refill as needed

## 2013-09-18 NOTE — Progress Notes (Signed)
  Subjective:    Patient ID: Kimberly Valenzuela, female    DOB: 1955-12-02, 57 y.o.   MRN: [id number]  HPI Routine office visit Anxiety--+ stress, husband has an alcohol problem, symptoms however are well-controlled with tranxene once or twice a day at most. Hypertension, good medication compliance. History of osteoporosis, would like to discuss treatment, see assessment and plan Discuss flu shot, declined  Past Medical History  Diagnosis Date  . Hypertension   . Dyslipidemia     high TG , dx ~ 01-2011  . Menopause     after BCP were d/c ~ 2010  . Anxiety 08/03/2011   Past Surgical History  Procedure Laterality Date  . Cesarean section     History   Social History  . Marital Status: Married    Spouse Name: N/A    Number of Children: 1  . Years of Education: N/A   Occupational History  . sales     Social History Main Topics  . Smoking status: Current Every Day Smoker -- 0.50 packs/day for 34 years    Types: Cigarettes  . Smokeless tobacco: Never Used  . Alcohol Use: 4.2 oz/week    7 Glasses of wine per week     Comment: socially , wine  . Drug Use: 2.00 per week    Special: Marijuana  . Sexual Activity: Not on file   Other Topics Concern  . Not on file   Social History Narrative   Lost parents 2014   Lives w/ husband who has a ETOH issue    Review of Systems No chest pain or SOB Denies depression per se Ambulatory BPs within normal. Reports she enjoys a drink most nights, usually 1 glass of wine.     Objective:   Physical Exam BP 115/75  Pulse 86  Temp(Src) 98.6 F (37 C)  Ht 5\' 3"  (1.6 m)  Wt 127 lb (57.607 kg)  BMI 22.5 kg/m2  SpO2 99% General -- alert, well-developed, NAD.  Lungs -- normal respiratory effort, no intercostal retractions, no accessory muscle use, and normal breath sounds.  Heart-- normal rate, regular rhythm, no murmur.  Extremities-- no pretibial edema bilaterally  Neurologic--  alert & oriented X3. Speech normal, gait normal, strength  normal in all extremities.  Psych-- Cognition and judgment appear intact. Cooperative with normal attention span and concentration. No anxious appearing , no depressed appearing.     Assessment & Plan:

## 2013-09-18 NOTE — Assessment & Plan Note (Signed)
See previous entry, we were unable to set up reclast, we agreed to start Boniva. Precautions discussed. Also recommend calcium and vitamin D.

## 2013-09-18 NOTE — Patient Instructions (Addendum)
Get the urine test (UDS) before you leave  Next visit in 6 months  for a   follow up. No Fasting Please make an appointment

## 2013-09-18 NOTE — Assessment & Plan Note (Signed)
Well controlled, last creat wnl, no change

## 2013-09-19 ENCOUNTER — Encounter: Payer: Self-pay | Admitting: Internal Medicine

## 2013-09-24 ENCOUNTER — Other Ambulatory Visit: Payer: BC Managed Care – PPO

## 2013-10-13 ENCOUNTER — Telehealth: Payer: Self-pay | Admitting: *Deleted

## 2013-10-13 NOTE — Telephone Encounter (Signed)
UDS results from 09/18/13 deemed low risk by provider

## 2013-11-19 DIAGNOSIS — M549 Dorsalgia, unspecified: Secondary | ICD-10-CM

## 2013-11-19 HISTORY — DX: Dorsalgia, unspecified: M54.9

## 2013-12-01 ENCOUNTER — Encounter: Payer: Self-pay | Admitting: Internal Medicine

## 2014-02-15 ENCOUNTER — Other Ambulatory Visit: Payer: Self-pay | Admitting: Internal Medicine

## 2014-02-15 ENCOUNTER — Telehealth: Payer: Self-pay | Admitting: *Deleted

## 2014-02-15 MED ORDER — CLORAZEPATE DIPOTASSIUM 7.5 MG PO TABS: ORAL_TABLET | ORAL | Status: DC

## 2014-02-15 NOTE — Telephone Encounter (Signed)
rx faxed to [facility] [place]  -pt scheduled check may 29th

## 2014-02-15 NOTE — Telephone Encounter (Signed)
rx refill - tranxene 7.5mg  Last refilled- 09/18/13 #90 / 2 rf  Last OV- 09/18/13  UDS- 09/18/13 LOW risk/

## 2014-02-15 NOTE — Telephone Encounter (Signed)
Ok , rx printed  Also, advise patient she is due for a check up

## 2014-03-06 ENCOUNTER — Other Ambulatory Visit: Payer: Self-pay | Admitting: Internal Medicine

## 2014-04-16 ENCOUNTER — Ambulatory Visit (INDEPENDENT_AMBULATORY_CARE_PROVIDER_SITE_OTHER): Payer: BC Managed Care – PPO | Admitting: Internal Medicine

## 2014-04-16 ENCOUNTER — Encounter: Payer: Self-pay | Admitting: Internal Medicine

## 2014-04-16 VITALS — BP 112/70 | HR 81 | Temp 98.5°F | Wt 127.0 lb

## 2014-04-16 DIAGNOSIS — M810 Age-related osteoporosis without current pathological fracture: Secondary | ICD-10-CM

## 2014-04-16 DIAGNOSIS — F419 Anxiety disorder, unspecified: Secondary | ICD-10-CM

## 2014-04-16 DIAGNOSIS — F411 Generalized anxiety disorder: Secondary | ICD-10-CM

## 2014-04-16 DIAGNOSIS — I10 Essential (primary) hypertension: Secondary | ICD-10-CM

## 2014-04-16 DIAGNOSIS — E785 Hyperlipidemia, unspecified: Secondary | ICD-10-CM

## 2014-04-16 DIAGNOSIS — Z0000 Encounter for general adult medical examination without abnormal findings: Secondary | ICD-10-CM

## 2014-04-16 LAB — COMPREHENSIVE METABOLIC PANEL
ALT: 24 U/L (ref 0–35)
AST: 24 U/L (ref 0–37)
Albumin: 4.5 g/dL (ref 3.5–5.2)
Alkaline Phosphatase: 54 U/L (ref 39–117)
BUN: 21 mg/dL (ref 6–23)
CO2: 27 mEq/L (ref 19–32)
Calcium: 9.8 mg/dL (ref 8.4–10.5)
Chloride: 106 mEq/L (ref 96–112)
Creatinine, Ser: 1.3 mg/dL — ABNORMAL HIGH (ref 0.4–1.2)
GFR: 44.76 mL/min — ABNORMAL LOW (ref >60.00)
Glucose, Bld: 89 mg/dL (ref 70–99)
Potassium: 3.7 mEq/L (ref 3.5–5.1)
Sodium: 140 mEq/L (ref 135–145)
Total Bilirubin: 0.5 mg/dL (ref 0.2–1.2)
Total Protein: 6.9 g/dL (ref 6.0–8.3)

## 2014-04-16 LAB — CBC WITH DIFFERENTIAL/PLATELET
Basophils Absolute: 0 10*3/uL (ref 0.0–0.1)
Basophils Relative: 0.4 % (ref 0.0–3.0)
Eosinophils Absolute: 0.2 10*3/uL (ref 0.0–0.7)
Eosinophils Relative: 2.4 % (ref 0.0–5.0)
HCT: 33.7 % — ABNORMAL LOW (ref 36.0–46.0)
Hemoglobin: 11.6 g/dL — ABNORMAL LOW (ref 12.0–15.0)
Lymphocytes Relative: 33.3 % (ref 12.0–46.0)
Lymphs Abs: 2.2 10*3/uL (ref 0.7–4.0)
MCHC: 34.5 g/dL (ref 30.0–36.0)
MCV: 96.1 fl (ref 78.0–100.0)
Monocytes Absolute: 0.3 10*3/uL (ref 0.1–1.0)
Monocytes Relative: 5.1 % (ref 3.0–12.0)
Neutro Abs: 3.9 10*3/uL (ref 1.4–7.7)
Neutrophils Relative %: 58.8 % (ref 43.0–77.0)
Platelets: 204 10*3/uL (ref 150.0–400.0)
RBC: 3.51 Mil/uL — ABNORMAL LOW (ref 3.87–5.11)
RDW: 12.7 % (ref 11.5–15.5)
WBC: 6.6 10*3/uL (ref 4.0–10.5)

## 2014-04-16 LAB — LIPID PANEL
Cholesterol: 217 mg/dL — ABNORMAL HIGH (ref 0–200)
HDL: 52.5 mg/dL (ref >39.00)
LDL Cholesterol: 122 mg/dL — ABNORMAL HIGH (ref 0–99)
Total CHOL/HDL Ratio: 4
Triglycerides: 211 mg/dL — ABNORMAL HIGH (ref 0.0–149.0)
VLDL: 42.2 mg/dL — ABNORMAL HIGH (ref 0.0–40.0)

## 2014-04-16 NOTE — Assessment & Plan Note (Addendum)
Good compliance with medications, reports ambulatory BPs are within normal. Plan: Continue with present care, labs

## 2014-04-16 NOTE — Progress Notes (Signed)
Pre-visit discussion using our clinic review tool. No additional management support is needed unless otherwise documented below in the visit note.  

## 2014-04-16 NOTE — Patient Instructions (Signed)
Get your blood work before you leave   Next visit is for a physical exam in 5-6 months ,  fasting Please make an appointment

## 2014-04-16 NOTE — Assessment & Plan Note (Signed)
Return to the office in 6 months for a CPX. Schedule a mammogram

## 2014-04-16 NOTE — Assessment & Plan Note (Signed)
Well controlled with Tranxene on average once a day. UDS 10- 2014 negative.  Plan-- RF as needed

## 2014-04-16 NOTE — Assessment & Plan Note (Signed)
Good compliance with Boniva, calcium, vitamin D and exercise.  Plan-- No change

## 2014-04-16 NOTE — Progress Notes (Signed)
   Subjective:    Patient ID: Kimberly Valenzuela, female    DOB: February 26, 1956, 58 y.o.   MRN: [id number]  DOS:  04/16/2014 Type of  Visit: ROV  here for evaluation of HTN, anxiety and osteopenia. Good medication compliance. Good compliance with calcium, vitamin D, he she remains active. Labs are reviewed, due for a CMP, FLP and CBC   ROS Denies chest pain, difficulty breathing, no nausea, vomiting, diarrhea  Past Medical History  Diagnosis Date  . Hypertension   . Dyslipidemia     high TG , dx ~ 01-2011  . Menopause     after BCP were d/c ~ 2010  . Anxiety 08/03/2011    Past Surgical History  Procedure Laterality Date  . Cesarean section      History   Social History  . Marital Status: Married    Spouse Name: N/A    Number of Children: 1  . Years of Education: N/A   Occupational History  . sales     Social History Main Topics  . Smoking status: Current Every Day Smoker -- 0.50 packs/day for 34 years    Types: Cigarettes  . Smokeless tobacco: Never Used  . Alcohol Use: 4.2 oz/week    7 Glasses of wine per week     Comment: socially , wine  . Drug Use: 2.00 per week    Special: Marijuana  . Sexual Activity: Not on file   Other Topics Concern  . Not on file   Social History Narrative   Lost parents 2014   Lives w/ husband who has a ETOH issue        Medication List       This list is accurate as of: 04/16/14 11:59 PM.  Always use your most recent med list.               clorazepate 7.5 MG tablet  Commonly known as:  TRANXENE  TAKE 1 TABLET BY MOUTH THREE TIMES DAILY AS NEEDED     ibandronate 150 MG tablet  Commonly known as:  BONIVA  Take 1 tablet (150 mg total) by mouth every 30 (thirty) days. Take in the morning with a full glass of water, on an empty stomach, and do not take anything else by mouth or lie down for the next 30 min.     lisinopril-hydrochlorothiazide 10-12.5 MG per tablet  Commonly known as:  PRINZIDE,ZESTORETIC  TAKE 1 TABLET BY MOUTH  EVERY DAY     ONE-A-DAY WOMENS FORMULA Tabs  Take 1 each by mouth daily. Womens Menopause Formula     Vitamin D 1000 UNITS capsule  Take 1,000 Units by mouth daily.           Objective:   Physical Exam BP 112/70  Pulse 81  Temp(Src) 98.5 F (36.9 C) (Oral)  Wt 127 lb (57.607 kg)  SpO2 97% General -- alert, well-developed, NAD.  Lungs -- normal respiratory effort, no intercostal retractions, no accessory muscle use, and normal breath sounds.  Heart-- normal rate, regular rhythm, no murmur.  Extremities-- no pretibial edema bilaterally  Neurologic--  alert & oriented X3. Speech normal, gait normal, strength normal in all extremities.  Psych-- Cognition and judgment appear intact. Cooperative with normal attention span and concentration. No anxious or depressed appearing.        Assessment & Plan:

## 2014-04-17 ENCOUNTER — Telehealth: Payer: Self-pay | Admitting: Internal Medicine

## 2014-04-17 DIAGNOSIS — Z1231 Encounter for screening mammogram for malignant neoplasm of breast: Secondary | ICD-10-CM

## 2014-04-17 NOTE — Telephone Encounter (Signed)
Relevant patient education mailed to patient.  

## 2014-04-19 ENCOUNTER — Encounter: Payer: Self-pay | Admitting: *Deleted

## 2014-04-30 NOTE — Addendum Note (Signed)
Addended by: Peggyann Shoals on: 04/30/2014 09:09 AM   Modules accepted: Orders

## 2014-04-30 NOTE — Addendum Note (Signed)
Addended by: Peggyann Shoals on: 04/30/2014 08:08 AM   Modules accepted: Orders

## 2014-05-13 ENCOUNTER — Telehealth: Payer: Self-pay | Admitting: *Deleted

## 2014-05-13 NOTE — Telephone Encounter (Signed)
Received Physician Results Form-Wellness Program paperwork via fax from the patient.  Billing sheet attached, forms filed out as much as possible, and placed in folder for Dr. Larose Kells to review and sign.//AB/CMA

## 2014-05-19 NOTE — Telephone Encounter (Signed)
Received completed and signed Physician Results form-Wellness Program form from Dr. Larose Kells.  Form faxed to [phone]) on (05/14/14).  Confirmation received.//AB/CMA

## 2014-07-08 ENCOUNTER — Ambulatory Visit (INDEPENDENT_AMBULATORY_CARE_PROVIDER_SITE_OTHER): Payer: BC Managed Care – PPO | Admitting: Internal Medicine

## 2014-07-08 ENCOUNTER — Encounter: Payer: Self-pay | Admitting: Internal Medicine

## 2014-07-08 VITALS — BP 112/68 | HR 73 | Temp 98.2°F | Wt 130.2 lb

## 2014-07-08 DIAGNOSIS — M7062 Trochanteric bursitis, left hip: Principal | ICD-10-CM

## 2014-07-08 DIAGNOSIS — M76899 Other specified enthesopathies of unspecified lower limb, excluding foot: Secondary | ICD-10-CM

## 2014-07-08 DIAGNOSIS — M7061 Trochanteric bursitis, right hip: Secondary | ICD-10-CM

## 2014-07-08 NOTE — Patient Instructions (Signed)
Ibuprofen  200 mg 2 tablets every 6 hours as needed for pain. Always take it with food because may cause gastritis and ulcers. If you notice nausea, stomach pain, change in the color of stools --->  Stop the medicine and let us know  ICE twice a day  Call if no better in 2 weeks      Trochanteric Bursitis You have hip pain due to trochanteric bursitis. Bursitis means that the sack near the outside of the hip is filled with fluid and inflamed. This sack is made up of protective soft tissue. The pain from trochanteric bursitis can be severe and keep you from sleep. It can radiate to the buttocks or down the outside of the thigh to the knee. The pain is almost always worse when rising from the seated or lying position and with walking. Pain can improve after you take a few steps. It happens more often in people with hip joint and lumbar spine problems, such as arthritis or previous surgery. Very rarely the trochanteric bursa can become infected, and antibiotics and/or surgery may be needed. Treatment often includes an injection of local anesthetic mixed with cortisone medicine. This medicine is injected into the area where it is most tender over the hip. Repeat injections may be necessary if the response to treatment is slow. You can apply ice packs over the tender area for 30 minutes every 2 hours for the next few days. Anti-inflammatory and/or narcotic pain medicine may also be helpful. Limit your activity for the next few days if the pain continues. See your caregiver in 5-10 days if you are not greatly improved.  SEEK IMMEDIATE MEDICAL CARE IF:  You develop severe pain, fever, or increased redness.  You have pain that radiates below the knee. EXERCISES STRETCHING EXERCISES - Trochanteric Bursitis  These exercises may help you when beginning to rehabilitate your injury. Your symptoms may resolve with or without further involvement from your physician, physical therapist, or athletic trainer. While  completing these exercises, remember:   Restoring tissue flexibility helps normal motion to return to the joints. This allows healthier, less painful movement and activity.  An effective stretch should be held for at least 30 seconds.  A stretch should never be painful. You should only feel a gentle lengthening or release in the stretched tissue. STRETCH - Iliotibial Band  On the floor or bed, lie on your side so your injured leg is on top. Bend your knee and grab your ankle.  Slowly bring your knee back so that your thigh is in line with your trunk. Keep your heel at your buttocks and gently arch your back so your head, shoulders and hips line up.  Slowly lower your leg so that your knee approaches the floor/bed until you feel a gentle stretch on the outside of your thigh. If you do not feel a stretch and your knee will not fall farther, place the heel of your opposite foot on top of your knee and pull your thigh down farther.  Hold this stretch for __________ seconds.  Repeat __________ times. Complete this exercise __________ times per day. STRETCH - Hamstrings, Supine   Lie on your back. Loop a belt or towel over the ball of your foot as shown.  Straighten your knee and slowly pull on the belt to raise your injured leg. Do not allow the knee to bend. Keep your opposite leg flat on the floor.  Raise the leg until you feel a gentle stretch behind your  knee or thigh. Hold this position for __________ seconds.  Repeat __________ times. Complete this stretch __________ times per day. STRETCH - Quadriceps, Prone   Lie on your stomach on a firm surface, such as a bed or padded floor.  Bend your knee and grasp your ankle. If you are unable to reach your ankle or pant leg, use a belt around your foot to lengthen your reach.  Gently pull your heel toward your buttocks. Your knee should not slide out to the side. You should feel a stretch in the front of your thigh and/or knee.  Hold this  position for __________ seconds.  Repeat __________ times. Complete this stretch __________ times per day. STRETCHING - Hip Flexors, Lunge Half kneel with your knee on the floor and your opposite knee bent and directly over your ankle.  Keep good posture with your head over your shoulders. Tighten your buttocks to point your tailbone downward; this will prevent your back from arching too much.  You should feel a gentle stretch in the front of your thigh and/or hip. If you do not feel any resistance, slightly slide your opposite foot forward and then slowly lunge forward so your knee once again lines up over your ankle. Be sure your tailbone remains pointed downward.  Hold this stretch for __________ seconds.  Repeat __________ times. Complete this stretch __________ times per day. STRETCH - Adductors, Lunge  While standing, spread your legs.  Lean away from your injured leg by bending your opposite knee. You may rest your hands on your thigh for balance.  You should feel a stretch in your inner thigh. Hold for __________ seconds.  Repeat __________ times. Complete this exercise __________ times per day. Document Released: 12/13/2004 Document Revised: 03/22/2014 Document Reviewed: 02/17/2009 [facility] Patient Information 2015 [place], [place]. This information is not intended to replace advice given to you by your health care provider. Make sure you discuss any questions you have with your health care provider.

## 2014-07-08 NOTE — Progress Notes (Signed)
Subjective:    Patient ID: Kimberly Valenzuela, female    DOB: [birth year], 58 y.o.   MRN: [id number]  DOS:  07/08/2014 Type of visit - description: acute  History: 3 weeks ago did yard work, 2 days later developed lower back pain, it got  better after she put some BenGay. Shortly after developed pain at the sides of the hips, like a cramp or pulling, worse by standing up or sitting. Back pain is now minimal  ROS Denies fever chills No bladder or bowel incontinence No fall or injury No rash anywhere. No paresthesias of the distal lower extremities  Past Medical History  Diagnosis Date  . Hypertension   . Dyslipidemia     high TG , dx ~ 01-2011  . Menopause     after BCP were d/c ~ 2010  . Anxiety 08/03/2011    Past Surgical History  Procedure Laterality Date  . Cesarean section      History   Social History  . Marital Status: Married    Spouse Name: N/A    Number of Children: 1  . Years of Education: N/A   Occupational History  . sales     Social History Main Topics  . Smoking status: Current Every Day Smoker -- 0.50 packs/day for 34 years    Types: Cigarettes  . Smokeless tobacco: Never Used  . Alcohol Use: 4.2 oz/week    7 Glasses of wine per week     Comment: socially , wine  . Drug Use: 2.00 per week    Special: Marijuana  . Sexual Activity: Not on file   Other Topics Concern  . Not on file   Social History Narrative   Lost parents 2014   Lives w/ husband who has a ETOH issue        Medication List       This list is accurate as of: 07/08/14 11:31 AM.  Always use your most recent med list.               clorazepate 7.5 MG tablet  Commonly known as:  TRANXENE  TAKE 1 TABLET BY MOUTH THREE TIMES DAILY AS NEEDED     ibandronate 150 MG tablet  Commonly known as:  BONIVA  Take 1 tablet (150 mg total) by mouth every 30 (thirty) days. Take in the morning with a full glass of water, on an empty stomach, and do not take anything else by mouth or lie down  for the next 30 min.     lisinopril-hydrochlorothiazide 10-12.5 MG per tablet  Commonly known as:  PRINZIDE,ZESTORETIC  TAKE 1 TABLET BY MOUTH EVERY DAY     ONE-A-DAY WOMENS FORMULA Tabs  Take 1 each by mouth daily. Womens Menopause Formula     Vitamin D 1000 UNITS capsule  Take 1,000 Units by mouth daily.           Objective:   Physical Exam BP 112/68  Pulse 73  Temp(Src) 98.2 F (36.8 C) (Oral)  Wt 130 lb 4 oz (59.081 kg)  SpO2 98% General -- alert, well-developed, NAD.  Back-- no TTP Extremities-- no pretibial edema bilaterally ; Hip range of motion normal,Slightly tender bilaterally at the trochanteric bursa Neurologic--  alert & oriented X3. Speech normal, gait appropriate for age, strength symmetric and appropriate for age.  DTRs symmetric. Psych-- Cognition and judgment appear intact. Cooperative with normal attention span and concentration. No anxious or depressed appearing.      Assessment & Plan:  Bilateral hip pain, likely trochanteric bursitis. Plan: Ibuprofen, GI precautions discussed. Ice the area. If no better will call, prednisone? Sports medicine referral?

## 2014-07-08 NOTE — Progress Notes (Signed)
Pre-visit discussion using our clinic review tool. No additional management support is needed unless otherwise documented below in the visit note.  

## 2014-07-12 ENCOUNTER — Telehealth: Payer: Self-pay | Admitting: Internal Medicine

## 2014-07-12 ENCOUNTER — Other Ambulatory Visit: Payer: Self-pay | Admitting: Internal Medicine

## 2014-07-12 DIAGNOSIS — M545 Low back pain: Secondary | ICD-10-CM

## 2014-07-12 MED ORDER — CLORAZEPATE DIPOTASSIUM 7.5 MG PO TABS: ORAL_TABLET | ORAL | Status: DC

## 2014-07-12 NOTE — Telephone Encounter (Signed)
done

## 2014-07-12 NOTE — Telephone Encounter (Signed)
Please see phone note below.   Pt is requesting Clorazepate refill.  Last OV: 07/08/2014 Last fill: 02/15/2014 # 90 with 1 RF Last UDS: 08/21/2013 Low Risk  Please Advise.

## 2014-07-12 NOTE — Telephone Encounter (Signed)
Medication faxed to pharmacy 

## 2014-07-12 NOTE — Telephone Encounter (Signed)
Caller name: Lilienne  Call back number: [phone]   Reason for call:  Pt was seen on 8/20 for lower back pain.  Still in a lot of pain and the ibuprofen is not helping.  Wants something else or something. Will come in if needed.    Pt also wants a refill on Rx clorazepate (TRANXENE) 7.5 MG tablet

## 2014-07-12 NOTE — Telephone Encounter (Signed)
Please advise on Pt's lower back pain, would you like for her to make an appt to come in and be seen?   Thanks.

## 2014-07-12 NOTE — Telephone Encounter (Signed)
Referral sent to Ortho.

## 2014-07-12 NOTE — Telephone Encounter (Signed)
Please also advise on the lower back pain and getting something stronger for the pain, or advising on other options.

## 2014-07-12 NOTE — Telephone Encounter (Signed)
Refer to sports medicine, please arrange

## 2014-07-13 ENCOUNTER — Telehealth: Payer: Self-pay

## 2014-07-13 MED ORDER — HYDROCODONE-ACETAMINOPHEN 5-325 MG PO TABS: 1.0000 | ORAL_TABLET | Freq: Three times a day (TID) | ORAL | Status: DC | PRN

## 2014-07-13 NOTE — Telephone Encounter (Signed)
She has already been referred to orthopedic surgery. Expect a phone call soon. To help with the pain, I printed a prescription for hydrocortisone to be taken 3 times a day as needed, will cause drowsiness, please ask the patient to be careful

## 2014-07-13 NOTE — Telephone Encounter (Signed)
Kimberly Valenzuela [phone] work - okay to leave message  [name] called to say that the ibuprofen is not working nor the ice packs. When she gets up in the mornings her back is cramping worse. She is still trying to work, but needs something to get her thru this. She called yesterday but has not heard back from anyone. She also said to let her know if she needed to make an appointment and come back in.

## 2014-07-13 NOTE — Telephone Encounter (Signed)
Please Advise

## 2014-07-13 NOTE — Telephone Encounter (Signed)
Spoke with Pt, referral placed to Ortho yesterday, medication is ready for pick up at front desk, warned pt that medication can make her drowsy.

## 2014-08-04 ENCOUNTER — Ambulatory Visit: Payer: BC Managed Care – PPO | Admitting: Physical Therapy

## 2014-08-05 ENCOUNTER — Ambulatory Visit: Payer: BC Managed Care – PPO | Attending: Sports Medicine

## 2014-08-05 DIAGNOSIS — IMO0001 Reserved for inherently not codable concepts without codable children: Secondary | ICD-10-CM | POA: Insufficient documentation

## 2014-08-05 DIAGNOSIS — M545 Low back pain, unspecified: Secondary | ICD-10-CM | POA: Insufficient documentation

## 2014-08-05 DIAGNOSIS — M6281 Muscle weakness (generalized): Secondary | ICD-10-CM | POA: Diagnosis not present

## 2014-08-05 DIAGNOSIS — I10 Essential (primary) hypertension: Secondary | ICD-10-CM | POA: Insufficient documentation

## 2014-08-09 ENCOUNTER — Ambulatory Visit: Payer: BC Managed Care – PPO | Admitting: Physical Therapy

## 2014-08-16 ENCOUNTER — Ambulatory Visit: Payer: Self-pay | Admitting: Neurology

## 2014-08-20 ENCOUNTER — Ambulatory Visit: Payer: Self-pay | Admitting: Neurology

## 2014-10-04 ENCOUNTER — Other Ambulatory Visit: Payer: Self-pay | Admitting: Internal Medicine

## 2014-12-21 ENCOUNTER — Telehealth: Payer: Self-pay | Admitting: Internal Medicine

## 2014-12-21 MED ORDER — LISINOPRIL-HYDROCHLOROTHIAZIDE 10-12.5 MG PO TABS: 1.0000 | ORAL_TABLET | Freq: Every day | ORAL | Status: DC

## 2014-12-21 NOTE — Telephone Encounter (Signed)
Caller name: Kamesha Relation to pt: self Call back number: [phone] Pharmacy: [facility] on [address]  Reason for call:   Patient scheduled cpe for 12/23/14 but has run out of lisinopril and would like one refill sent in.

## 2014-12-21 NOTE — Telephone Encounter (Signed)
Refills sent to [facility] as requested.  

## 2014-12-23 ENCOUNTER — Encounter: Payer: Self-pay | Admitting: Internal Medicine

## 2014-12-23 ENCOUNTER — Ambulatory Visit (INDEPENDENT_AMBULATORY_CARE_PROVIDER_SITE_OTHER): Payer: BLUE CROSS/BLUE SHIELD | Admitting: Internal Medicine

## 2014-12-23 VITALS — BP 114/78 | HR 78 | Temp 98.1°F | Ht 63.0 in | Wt 130.0 lb

## 2014-12-23 DIAGNOSIS — D509 Iron deficiency anemia, unspecified: Secondary | ICD-10-CM

## 2014-12-23 DIAGNOSIS — M810 Age-related osteoporosis without current pathological fracture: Secondary | ICD-10-CM

## 2014-12-23 DIAGNOSIS — Z0000 Encounter for general adult medical examination without abnormal findings: Secondary | ICD-10-CM

## 2014-12-23 LAB — CBC WITH DIFFERENTIAL/PLATELET
Basophils Absolute: 0 10*3/uL (ref 0.0–0.1)
Basophils Relative: 0.5 % (ref 0.0–3.0)
Eosinophils Absolute: 0.1 10*3/uL (ref 0.0–0.7)
Eosinophils Relative: 2.4 % (ref 0.0–5.0)
HCT: 32.5 % — ABNORMAL LOW (ref 36.0–46.0)
Hemoglobin: 11.2 g/dL — ABNORMAL LOW (ref 12.0–15.0)
Lymphocytes Relative: 30.4 % (ref 12.0–46.0)
Lymphs Abs: 1.8 10*3/uL (ref 0.7–4.0)
MCHC: 34.5 g/dL (ref 30.0–36.0)
MCV: 94.1 fl (ref 78.0–100.0)
Monocytes Absolute: 0.3 10*3/uL (ref 0.1–1.0)
Monocytes Relative: 5.4 % (ref 3.0–12.0)
Neutro Abs: 3.6 10*3/uL (ref 1.4–7.7)
Neutrophils Relative %: 61.3 % (ref 43.0–77.0)
Platelets: 229 10*3/uL (ref 150.0–400.0)
RBC: 3.45 Mil/uL — ABNORMAL LOW (ref 3.87–5.11)
RDW: 13.2 % (ref 11.5–15.5)
WBC: 5.9 10*3/uL (ref 4.0–10.5)

## 2014-12-23 LAB — BASIC METABOLIC PANEL
BUN: 23 mg/dL (ref 6–23)
CO2: 26 mEq/L (ref 19–32)
Calcium: 9.2 mg/dL (ref 8.4–10.5)
Chloride: 109 mEq/L (ref 96–112)
Creatinine, Ser: 1.32 mg/dL — ABNORMAL HIGH (ref 0.40–1.20)
GFR: 43.87 mL/min — ABNORMAL LOW (ref >60.00)
Glucose, Bld: 84 mg/dL (ref 70–99)
Potassium: 4 mEq/L (ref 3.5–5.1)
Sodium: 140 mEq/L (ref 135–145)

## 2014-12-23 LAB — LIPID PANEL
Cholesterol: 209 mg/dL — ABNORMAL HIGH (ref 0–200)
HDL: 58.7 mg/dL (ref >39.00)
LDL Cholesterol: 117 mg/dL — ABNORMAL HIGH (ref 0–99)
NonHDL: 150.3
Total CHOL/HDL Ratio: 4
Triglycerides: 165 mg/dL — ABNORMAL HIGH (ref 0.0–149.0)
VLDL: 33 mg/dL (ref 0.0–40.0)

## 2014-12-23 LAB — IRON: Iron: 72 ug/dL (ref 42–145)

## 2014-12-23 LAB — FERRITIN: Ferritin: 7.4 ng/mL — ABNORMAL LOW (ref 10.0–291.0)

## 2014-12-23 MED ORDER — LISINOPRIL-HYDROCHLOROTHIAZIDE 10-12.5 MG PO TABS: 1.0000 | ORAL_TABLET | Freq: Every day | ORAL | Status: DC

## 2014-12-23 NOTE — Assessment & Plan Note (Signed)
Continue Boniva, calcium and vitamin D, check a bone density test

## 2014-12-23 NOTE — Progress Notes (Signed)
Pre visit review using our clinic review tool, if applicable. No additional management support is needed unless otherwise documented below in the visit note. 

## 2014-12-23 NOTE — Patient Instructions (Signed)
Get your blood work before you leave  We also need a UDS  schedule an appointment for you PAP smear at your earliest convenience     Please come back to the office in 6 months  for a routine check up     Smoking Cessation Quitting smoking is important to your health and has many advantages. However, it is not always easy to quit since nicotine is a very addictive drug. Oftentimes, people try 3 times or more before being able to quit. This document explains the best ways for you to prepare to quit smoking. Quitting takes hard work and a lot of effort, but you can do it. ADVANTAGES OF QUITTING SMOKING  You will live longer, feel better, and live better.  Your body will feel the impact of quitting smoking almost immediately.  Within 20 minutes, blood pressure decreases. Your pulse returns to its normal level.  After 8 hours, carbon monoxide levels in the blood return to normal. Your oxygen level increases.  After 24 hours, the chance of having a heart attack starts to decrease. Your breath, hair, and body stop smelling like smoke.  After 48 hours, damaged nerve endings begin to recover. Your sense of taste and smell improve.  After 72 hours, the body is virtually free of nicotine. Your bronchial tubes relax and breathing becomes easier.  After 2 to 12 weeks, lungs can hold more air. Exercise becomes easier and circulation improves.  The risk of having a heart attack, stroke, cancer, or lung disease is greatly reduced.  After 1 year, the risk of coronary heart disease is cut in half.  After 5 years, the risk of stroke falls to the same as a nonsmoker.  After 10 years, the risk of lung cancer is cut in half and the risk of other cancers decreases significantly.  After 15 years, the risk of coronary heart disease drops, usually to the level of a nonsmoker.  If you are pregnant, quitting smoking will improve your chances of having a healthy baby.  The people you live with,  especially any children, will be healthier.  You will have extra money to spend on things other than cigarettes. QUESTIONS TO THINK ABOUT BEFORE ATTEMPTING TO QUIT You may want to talk about your answers with your health care provider.  Why do you want to quit?  If you tried to quit in the past, what helped and what did not?  What will be the most difficult situations for you after you quit? How will you plan to handle them?  Who can help you through the tough times? Your family? Friends? A health care provider?  What pleasures do you get from smoking? What ways can you still get pleasure if you quit? Here are some questions to ask your health care provider:  How can you help me to be successful at quitting?  What medicine do you think would be best for me and how should I take it?  What should I do if I need more help?  What is smoking withdrawal like? How can I get information on withdrawal? GET READY  Set a quit date.  Change your environment by getting rid of all cigarettes, ashtrays, matches, and lighters in your home, car, or work. Do not let people smoke in your home.  Review your past attempts to quit. Think about what worked and what did not. GET SUPPORT AND ENCOURAGEMENT You have a better chance of being successful if you have help. You can  get support in many ways.  Tell your family, friends, and coworkers that you are going to quit and need their support. Ask them not to smoke around you.  Get individual, group, or telephone counseling and support. Programs are available at [employer] and health centers. Call your local health department for information about programs in your area.  Spiritual beliefs and practices may help some smokers quit.  Download a "quit meter" on your computer to keep track of quit statistics, such as how long you have gone without smoking, cigarettes not smoked, and money saved.  Get a self-help book about quitting smoking and staying  off tobacco. [place] yourself from urges to smoke. Talk to someone, go for a walk, or occupy your time with a task.  Change your normal routine. Take a different route to work. Drink tea instead of coffee. Eat breakfast in a different place.  Reduce your stress. Take a hot bath, exercise, or read a book.  Plan something enjoyable to do every day. Reward yourself for not smoking.  Explore interactive web-based programs that specialize in helping you quit. GET MEDICINE AND USE IT CORRECTLY Medicines can help you stop smoking and decrease the urge to smoke. Combining medicine with the above behavioral methods and support can greatly increase your chances of successfully quitting smoking.  Nicotine replacement therapy helps deliver nicotine to your body without the negative effects and risks of smoking. Nicotine replacement therapy includes nicotine gum, lozenges, inhalers, nasal sprays, and skin patches. Some may be available over-the-counter and others require a prescription.  Antidepressant medicine helps people abstain from smoking, but how this works is unknown. This medicine is available by prescription.  Nicotinic receptor partial agonist medicine simulates the effect of nicotine in your brain. This medicine is available by prescription. Ask your health care provider for advice about which medicines to use and how to use them based on your health history. Your health care provider will tell you what side effects to look out for if you choose to be on a medicine or therapy. Carefully read the information on the package. Do not use any other product containing nicotine while using a nicotine replacement product.  RELAPSE OR DIFFICULT SITUATIONS Most relapses occur within the first 3 months after quitting. Do not be discouraged if you start smoking again. Remember, most people try several times before finally quitting. You may have symptoms of withdrawal because  your body is used to nicotine. You may crave cigarettes, be irritable, feel very hungry, cough often, get headaches, or have difficulty concentrating. The withdrawal symptoms are only temporary. They are strongest when you first quit, but they will go away within 10-14 days. To reduce the chances of relapse, try to:  Avoid drinking alcohol. Drinking lowers your chances of successfully quitting.  Reduce the amount of caffeine you consume. Once you quit smoking, the amount of caffeine in your body increases and can give you symptoms, such as a rapid heartbeat, sweating, and anxiety.  Avoid smokers because they can make you want to smoke.  Do not let weight gain distract you. Many smokers will gain weight when they quit, usually less than 10 pounds. Eat a healthy diet and stay active. You can always lose the weight gained after you quit.  Find ways to improve your mood other than smoking. FOR MORE INFORMATION  www.smokefree.gov  Document Released: 10/30/2001 Document Revised: 03/22/2014 Document Reviewed: 02/14/2012 [facility] Patient Information 2015 [place], [place]. This information is  not intended to replace advice given to you by your health care provider. Make sure you discuss any questions you have with your health care provider.

## 2014-12-23 NOTE — Progress Notes (Signed)
Subjective:    Patient ID: Kimberly Valenzuela, female    DOB: [birth year], 59 y.o.   MRN: [id number]  DOS:  12/23/2014 Type of visit - description : cpx Interval history: Was seen w/  Back pain, resolved  No BP meds x 3 days otherwise good compliance   Review of Systems No fever chills or weight loss No runny nose or sore throat No chest pain or difficulty breathing No nausea, vomiting, diarrhea No cough or sputum production Anxiety and depression negative. Good compliance with medication No dysuria gross hematuria. Complaining of  Vaginal dryness. No vaginal discharge or bleeding No headache or dizziness  Past Medical History  Diagnosis Date  . Hypertension   . Dyslipidemia     high TG , dx ~ 01-2011  . Menopause     after BCP were d/c ~ 2010  . Anxiety 08/03/2011  . Back pain 2015    MRI, local injection, better after injection    Past Surgical History  Procedure Laterality Date  . Cesarean section      History   Social History  . Marital Status: Married    Spouse Name: N/A    Number of Children: 1  . Years of Education: N/A   Occupational History  . sales     Social History Main Topics  . Smoking status: Current Every Day Smoker -- 0.50 packs/day for 34 years    Types: Cigarettes  . Smokeless tobacco: Never Used  . Alcohol Use: 4.2 oz/week    7 Glasses of wine per week     Comment: socially , wine  . Drug Use: 2.00 per week    Special: Marijuana  . Sexual Activity: Not on file   Other Topics Concern  . Not on file   Social History Narrative   Lost parents 2014   Lives w/ husband who has a ETOH issue     Family History  Problem Relation Age of Onset  . Diabetes Neg Hx   . Coronary artery disease Neg Hx   . Stroke Neg Hx   . Breast cancer Neg Hx   . Ovarian cancer Mother 25  . Colon cancer Maternal Grandmother     dx in her 18s  . Renal cancer Maternal Grandfather     dx in his 95s  . Lung cancer Maternal Uncle     smoker      Medication List         This list is accurate as of: 12/23/14  3:14 PM.  Always use your most recent med list.               clorazepate 7.5 MG tablet  Commonly known as:  TRANXENE  TAKE 1 TABLET BY MOUTH THREE TIMES DAILY AS NEEDED     ibandronate 150 MG tablet  Commonly known as:  BONIVA  SEE NOTES     lisinopril-hydrochlorothiazide 10-12.5 MG per tablet  Commonly known as:  PRINZIDE,ZESTORETIC  Take 1 tablet by mouth daily.     ONE-A-DAY WOMENS FORMULA Tabs  Take 1 each by mouth daily. Womens Menopause Formula     Vitamin D 1000 UNITS capsule  Take 1,000 Units by mouth daily.           Objective:   Physical Exam  Constitutional: She is oriented to person, place, and time. She appears well-developed. No distress.  HENT:  Head: Normocephalic and atraumatic.  Neck: Normal range of motion. Neck supple. No tracheal deviation present. No thyromegaly  present.  Normal carotid pulses  Cardiovascular:  RRR, no murmur , rub or gallop  Pulmonary/Chest: Effort normal. No stridor. No respiratory distress.  CTA B  Abdominal: Soft. Bowel sounds are normal. She exhibits no distension and no mass. There is no tenderness. There is no rebound and no guarding.  No organomegaly  Musculoskeletal: She exhibits no edema or tenderness.  Lymphadenopathy:    She has no cervical adenopathy.  Neurological: She is alert and oriented to person, place, and time. No cranial nerve deficit. She exhibits normal muscle tone. Coordination normal.  Speech normal, gait unassisted and normal for age, motor strength appropriate for age   Skin: Skin is warm and dry. No pallor.  No jaundice  Psychiatric: She has a normal mood and affect. Her behavior is normal. Judgment and thought content normal.  Vitals reviewed.        Assessment & Plan:   Problem List Items Addressed This Visit      Musculoskeletal and Integument   Osteoporosis    Continue Boniva, calcium and vitamin D, check a bone density test        Other    Annual physical exam - Primary    Tdap 2012 Zostavax discussed  Cscope 04-2012--->  Polyps, 59 years  Female care  Last MMG 03-2012, abnormal on the R , f/u u/s R was ok, next was recommended in 1 year, did not get the MMG done, no reason per pt--- schedule a MMG ! H/o abnormal PAP x 1 in the 90s, f/u by normal ones, PAP 2013 (-) next was due ~2015, declined a referral or do the female exam today, rec to RTC  Patient has a family history of ovarian cancer, grandmother and mother.genetictesting neg 2014  Diet-exercise discussed  Other issues: Hypertension, BP is very good today despite the patient not taking medication for 3 days. Refill medicines Anxiety, well controlled with benzodiazepines as needed, check a UDS            Relevant Orders   Basic metabolic panel   CBC with Differential/Platelet   Lipid panel   Ferritin   Iron

## 2014-12-23 NOTE — Assessment & Plan Note (Addendum)
Tdap 2012 Zostavax discussed  Cscope 04-2012--->  Polyps, 59 years  Female care  Last MMG 03-2012, abnormal on the R , f/u u/s R was ok, next was recommended in 1 year, did not get the MMG done, no reason per pt--- schedule a MMG ! H/o abnormal PAP x 1 in the 90s, f/u by normal ones, PAP 2013 (-) next was due ~2015, declined a referral or do the female exam today, rec to RTC  Patient has a family history of ovarian cancer, grandmother and mother. Had genetic testing  2014 ---> negative  Diet-exercise discussed  Other issues: Hypertension, BP is very good today despite the patient not taking medication for 3 days. Refill medicines Anxiety, well controlled with benzodiazepines as needed, check a UDS

## 2014-12-27 NOTE — Addendum Note (Signed)
Addended by: Wilfrid Lund on: 12/27/2014 10:32 AM   Modules accepted: Orders

## 2015-02-03 ENCOUNTER — Telehealth: Payer: Self-pay

## 2015-02-03 NOTE — Telephone Encounter (Signed)
UDS: 12/23/2014  Positive for Oxazepam Positive for THC   Moderate risk per Dr. Larose Kells 02/02/2015

## 2015-02-07 ENCOUNTER — Telehealth: Payer: Self-pay | Admitting: Internal Medicine

## 2015-02-07 MED ORDER — CLORAZEPATE DIPOTASSIUM 7.5 MG PO TABS: ORAL_TABLET | ORAL | Status: DC

## 2015-02-07 NOTE — Telephone Encounter (Signed)
Pt is requesting refill on Tranxene.  Last OV: 12/23/2014 Last Fill: 07/12/2014 # 90 2RF UDS: 12/23/2014 Moderate risk  Please advise.

## 2015-02-07 NOTE — Telephone Encounter (Signed)
Spoke with Claiborne Billings, Pt's spouse. Informed him that Rx is ready for pick up at front desk. Contract printed.

## 2015-02-07 NOTE — Telephone Encounter (Signed)
Rx printed, awaiting signature. 

## 2015-02-07 NOTE — Telephone Encounter (Signed)
Printed #90 no refills

## 2015-02-07 NOTE — Telephone Encounter (Signed)
Caller name: Tse, Dana Relation to pt: self  Call back number: Best work [phone] Pharmacy: [id number] - [place], [place] - [address]  Reason for call:  Pt requesting a refill clorazepate (TRANXENE) 7.5 MG tablet to hold her over until next appointment 03/14/15. Please advise

## 2015-03-14 ENCOUNTER — Encounter: Payer: Self-pay | Admitting: Internal Medicine

## 2015-03-14 ENCOUNTER — Other Ambulatory Visit (HOSPITAL_COMMUNITY)
Admission: RE | Admit: 2015-03-14 | Discharge: 2015-03-14 | Disposition: A | Discharge status: Home / Self Care | Payer: BLUE CROSS/BLUE SHIELD | Source: Ambulatory Visit | Attending: Internal Medicine | Admitting: Internal Medicine

## 2015-03-14 ENCOUNTER — Ambulatory Visit (INDEPENDENT_AMBULATORY_CARE_PROVIDER_SITE_OTHER): Payer: BLUE CROSS/BLUE SHIELD | Admitting: Internal Medicine

## 2015-03-14 VITALS — BP 122/66 | HR 65 | Temp 98.2°F | Ht 63.0 in | Wt 127.2 lb

## 2015-03-14 DIAGNOSIS — D649 Anemia, unspecified: Secondary | ICD-10-CM | POA: Diagnosis not present

## 2015-03-14 DIAGNOSIS — Z01419 Encounter for gynecological examination (general) (routine) without abnormal findings: Secondary | ICD-10-CM | POA: Insufficient documentation

## 2015-03-14 NOTE — Progress Notes (Signed)
Subjective:    Patient ID: Kimberly Valenzuela, female    DOB: 27-Mar-1956, 59 y.o.   MRN: [id number]  DOS:  03/14/2015 Type of visit - description : here for a pap Interval history: In general feels well. Was recently diagnosed with iron deficiency, did not get to see a GI. Did not get a mammogram.    Review of Systems Denies nausea, vomiting, diarrhea Self breast exam negative Denies any vaginal discharge or bleeding  Past Medical History  Diagnosis Date  . Hypertension   . Dyslipidemia     high TG , dx ~ 01-2011  . Menopause     after BCP were d/c ~ 2010  . Anxiety 08/03/2011  . Back pain 2015    MRI, local injection, better after injection    Past Surgical History  Procedure Laterality Date  . Cesarean section      History   Social History  . Marital Status: Married    Spouse Name: N/A  . Number of Children: 1  . Years of Education: N/A   Occupational History  . sales     Social History Main Topics  . Smoking status: Current Every Day Smoker -- 0.50 packs/day for 34 years    Types: Cigarettes  . Smokeless tobacco: Never Used  . Alcohol Use: 4.2 oz/week    7 Glasses of wine per week     Comment: socially , wine  . Drug Use: 2.00 per week    Special: Marijuana  . Sexual Activity: Not on file   Other Topics Concern  . Not on file   Social History Narrative   Lost parents 2014   Lives w/ husband who has a ETOH issue        Medication List       This list is accurate as of: 03/14/15 11:59 PM.  Always use your most recent med list.               clorazepate 7.5 MG tablet  Commonly known as:  TRANXENE  TAKE 1 TABLET BY MOUTH THREE TIMES DAILY AS NEEDED     ibandronate 150 MG tablet  Commonly known as:  BONIVA  SEE NOTES     lisinopril-hydrochlorothiazide 10-12.5 MG per tablet  Commonly known as:  PRINZIDE,ZESTORETIC  Take 1 tablet by mouth daily.     ONE-A-DAY WOMENS FORMULA Tabs  Take 1 each by mouth daily. Womens Menopause Formula     Vitamin D 1000 UNITS capsule  Take 1,000 Units by mouth daily.           Objective:   Physical Exam BP 122/66 mmHg  Pulse 65  Temp(Src) 98.2 F (36.8 C) (Oral)  Ht 5\' 3"  (1.6 m)  Wt 127 lb 3 oz (57.692 kg)  BMI 22.54 kg/m2  SpO2 97%   General:   Well developed, well nourished . NAD.   Breast: no dominant mass, skin and nipples normal to inspection on palpation, axillary areas without mass or lymphadenopathy Abdomen:  Not distended, soft, non-tender. No rebound or rigidity. No mass,organomegaly GU: External examination normal Vagina without lesions or discharge Cervix without lesions or d/c Bimanual exam no CMT, mass or discomfort Muscle skeletal: no pretibial edema bilaterally  Skin: Not pale. Not jaundice Neurologic:  alert & oriented X3.  Speech normal, gait appropriate for age and unassisted Psych--  Cognition and judgment appear intact.  Cooperative with normal attention span and concentration.  Behavior appropriate. No anxious or depressed appearing.  Assessment & Plan:    CPX Strongly recommend to get a mammogram, we are having a hard time communicating with her because she has no answering machine for her phone, the phone number for the radiology center provided Pap smears sent  Anemia, We were unable to communicate with her, see above. We are referring her to GI again, recommend to call GI directly if she doesn't hear from them soon.

## 2015-03-14 NOTE — Patient Instructions (Addendum)
Please get a mammogram   We are referring you to our GI doctors regards the anemia, if you don't hear from them, please  Call their office

## 2015-03-14 NOTE — Progress Notes (Signed)
Pre visit review using our clinic review tool, if applicable. No additional management support is needed unless otherwise documented below in the visit note. 

## 2015-03-16 LAB — CYTOLOGY - PAP

## 2015-04-11 ENCOUNTER — Other Ambulatory Visit: Payer: Self-pay

## 2015-04-11 DIAGNOSIS — Z1231 Encounter for screening mammogram for malignant neoplasm of breast: Secondary | ICD-10-CM

## 2015-04-13 ENCOUNTER — Telehealth: Payer: Self-pay | Admitting: Internal Medicine

## 2015-04-13 ENCOUNTER — Telehealth: Payer: Self-pay

## 2015-04-13 MED ORDER — CLORAZEPATE DIPOTASSIUM 7.5 MG PO TABS: 7.5000 mg | ORAL_TABLET | Freq: Three times a day (TID) | ORAL | Status: DC | PRN

## 2015-04-13 NOTE — Telephone Encounter (Signed)
Ok 90, needs UDS

## 2015-04-13 NOTE — Telephone Encounter (Signed)
Patient is scheduled for mammography 6.14.2016

## 2015-04-13 NOTE — Telephone Encounter (Signed)
°  Relation to pt: self Call back number: work # [phone] Pharmacy:  Reason for call:  Pt requesting a refill clorazepate (TRANXENE) 7.5 MG tablet. Pt states she has only 2 pills left and she will be going on vacation soon. Please advise when RX is ready for pick up

## 2015-04-13 NOTE — Telephone Encounter (Signed)
Pt is requesting refill on Tranxene.  Last OV: 03/14/2015 Last Fill: 02/07/2015 #90 0RF UDS: 12/23/2014 Moderate risk  Please advise.

## 2015-04-13 NOTE — Telephone Encounter (Signed)
Rx printed, awaiting MD signature.  

## 2015-04-14 NOTE — Telephone Encounter (Signed)
Pt called to f/u on RX. Notified that it is ready to be p/u.

## 2015-04-14 NOTE — Telephone Encounter (Signed)
Please inform Pt that Rx is ready for pick up at her convenience. Thanks.

## 2015-04-26 ENCOUNTER — Ambulatory Visit
Admission: RE | Admit: 2015-04-26 | Discharge: 2015-04-26 | Disposition: A | Discharge status: Home / Self Care | Payer: BLUE CROSS/BLUE SHIELD | Source: Ambulatory Visit

## 2015-04-26 DIAGNOSIS — Z1231 Encounter for screening mammogram for malignant neoplasm of breast: Secondary | ICD-10-CM

## 2015-04-28 ENCOUNTER — Telehealth: Payer: Self-pay

## 2015-04-28 NOTE — Telephone Encounter (Signed)
UDS: 04/13/2015   Positive for Clorazepate Negative for THC   Low risk per Dr. Larose Kells 04/28/2015

## 2015-05-03 ENCOUNTER — Ambulatory Visit: Payer: BLUE CROSS/BLUE SHIELD

## 2015-06-07 ENCOUNTER — Telehealth: Payer: Self-pay | Admitting: Internal Medicine

## 2015-06-07 MED ORDER — CLORAZEPATE DIPOTASSIUM 7.5 MG PO TABS: 7.5000 mg | ORAL_TABLET | Freq: Three times a day (TID) | ORAL | Status: DC | PRN

## 2015-06-07 NOTE — Telephone Encounter (Signed)
Caller name: Jenilyn Relation to pt: Call back number: [phone] Pharmacy: [facility] at [address]  Reason for call:   Requesting refill of clorazapate

## 2015-06-07 NOTE — Telephone Encounter (Signed)
Okay #90 and 1 refill 

## 2015-06-07 NOTE — Telephone Encounter (Signed)
Rx printed, awaiting MD signature.  

## 2015-06-07 NOTE — Telephone Encounter (Signed)
Rx faxed to [facility] pharmacy.  

## 2015-06-07 NOTE — Telephone Encounter (Signed)
Pt is requesting refill on Clorazepate.   Last OV: 03/14/2015 Last Fill: 04/13/2015 #90 0RF UDS: 04/13/2015 Low risk  Please advise.

## 2015-07-05 ENCOUNTER — Ambulatory Visit (INDEPENDENT_AMBULATORY_CARE_PROVIDER_SITE_OTHER): Payer: BLUE CROSS/BLUE SHIELD | Admitting: Internal Medicine

## 2015-07-05 ENCOUNTER — Encounter: Payer: Self-pay | Admitting: Internal Medicine

## 2015-07-05 VITALS — BP 106/58 | HR 63 | Temp 97.8°F | Ht 63.0 in | Wt 123.1 lb

## 2015-07-05 DIAGNOSIS — N289 Disorder of kidney and ureter, unspecified: Secondary | ICD-10-CM

## 2015-07-05 DIAGNOSIS — I10 Essential (primary) hypertension: Secondary | ICD-10-CM | POA: Diagnosis not present

## 2015-07-05 DIAGNOSIS — D179 Benign lipomatous neoplasm, unspecified: Secondary | ICD-10-CM | POA: Diagnosis not present

## 2015-07-05 DIAGNOSIS — Z114 Encounter for screening for human immunodeficiency virus [HIV]: Secondary | ICD-10-CM

## 2015-07-05 DIAGNOSIS — D649 Anemia, unspecified: Secondary | ICD-10-CM | POA: Diagnosis not present

## 2015-07-05 DIAGNOSIS — Z1159 Encounter for screening for other viral diseases: Secondary | ICD-10-CM

## 2015-07-05 MED ORDER — LISINOPRIL-HYDROCHLOROTHIAZIDE 10-12.5 MG PO TABS: 1.0000 | ORAL_TABLET | Freq: Every day | ORAL | Status: DC

## 2015-07-05 NOTE — Assessment & Plan Note (Signed)
Controlled, check a BMP 

## 2015-07-05 NOTE — Assessment & Plan Note (Signed)
The patient remains reluctant to see GI, not taking iron supplements. The last time we checked, the anemia was iron deficiency. Plan: Labs, recommend to see GI, phone number provided. Recommend to start taking OTC iron supplements

## 2015-07-05 NOTE — Assessment & Plan Note (Addendum)
New problem. Mass on her back consistent with lipoma, advise patient  can be 984% certain only after a biopsy, she declined, we agreed on observation, call if there is any change in size or the area gets harder.

## 2015-07-05 NOTE — Progress Notes (Signed)
Pre visit review using our clinic review tool, if applicable. No additional management support is needed unless otherwise documented below in the visit note. 

## 2015-07-05 NOTE — Patient Instructions (Addendum)
Get your blood work before you leave   Please call the GI office, because the anemia I think you need to see the gastroenterologist Dr. Carlean Purl. There number is [phone].

## 2015-07-05 NOTE — Progress Notes (Signed)
Subjective:    Patient ID: Kimberly Valenzuela, female    DOB: 1955/11/30, 59 y.o.   MRN: [id number]  DOS:  07/05/2015 Type of visit - description : routine office visit Interval history:  Hypertension: Good compliance with medication, BP today slightly low but she is asymptomatic Anemia: Not taking iron, taking a OTC vitamin. Did not see GI. She reports she takes BCs frequently but denies abdominal pain. Noted a lump several months ago and the back. No pain.    Review of Systems  Denies nausea, vomiting. No diarrhea or blood in the stools. No abdominal pain. No vaginal bleeding.  Past Medical History  Diagnosis Date  . Hypertension   . Dyslipidemia     high TG , dx ~ 01-2011  . Menopause     after BCP were d/c ~ 2010  . Anxiety 08/03/2011  . Back pain 2015    MRI, local injection, better after injection    Past Surgical History  Procedure Laterality Date  . Cesarean section      Social History   Social History  . Marital Status: Married    Spouse Name: N/A  . Number of Children: 1  . Years of Education: N/A   Occupational History  . sales     Social History Main Topics  . Smoking status: Current Every Day Smoker -- 0.50 packs/day for 34 years    Types: Cigarettes  . Smokeless tobacco: Never Used  . Alcohol Use: 4.2 oz/week    7 Glasses of wine per week     Comment: socially , wine  . Drug Use: 2.00 per week    Special: Marijuana  . Sexual Activity: Not on file   Other Topics Concern  . Not on file   Social History Narrative   Lost parents 2014   Lives w/ husband who has a ETOH issue        Medication List       This list is accurate as of: 07/05/15 11:59 PM.  Always use your most recent med list.               clorazepate 7.5 MG tablet  Commonly known as:  TRANXENE  Take 1 tablet (7.5 mg total) by mouth 3 (three) times daily as needed.     ibandronate 150 MG tablet  Commonly known as:  BONIVA  SEE NOTES     lisinopril-hydrochlorothiazide  10-12.5 MG per tablet  Commonly known as:  PRINZIDE,ZESTORETIC  Take 1 tablet by mouth daily.     ONE-A-DAY WOMENS FORMULA Tabs  Take 1 each by mouth daily. Womens Menopause Formula     Vitamin D 1000 UNITS capsule  Take 1,000 Units by mouth daily.           Objective:   Physical Exam  Skin:      BP 106/58 mmHg  Pulse 63  Temp(Src) 97.8 F (36.6 C) (Oral)  Ht 5\' 3"  (1.6 m)  Wt 123 lb 2 oz (55.849 kg)  BMI 21.82 kg/m2  SpO2 99% General:   Well developed, well nourished . NAD.  HEENT:  Normocephalic . Face symmetric, atraumatic Lungs:  CTA B Normal respiratory effort, no intercostal retractions, no accessory muscle use. Heart: RRR,  no murmur.  No pretibial edema bilaterally  Skin: Not pale. Not jaundice Neurologic:  alert & oriented X3.  Speech normal, gait appropriate for age and unassisted Psych--  Cognition and judgment appear intact.  Cooperative with normal attention span and concentration.  Behavior appropriate. No anxious or depressed appearing.      Assessment & Plan:

## 2015-07-06 LAB — HEPATITIS C ANTIBODY: HCV Ab: NEGATIVE

## 2015-07-06 LAB — BASIC METABOLIC PANEL
BUN: 35 mg/dL — ABNORMAL HIGH (ref 6–23)
CO2: 24 mEq/L (ref 19–32)
Calcium: 9.9 mg/dL (ref 8.4–10.5)
Chloride: 106 mEq/L (ref 96–112)
Creatinine, Ser: 1.59 mg/dL — ABNORMAL HIGH (ref 0.40–1.20)
GFR: 35.33 mL/min — ABNORMAL LOW (ref >60.00)
Glucose, Bld: 84 mg/dL (ref 70–99)
Potassium: 3.7 mEq/L (ref 3.5–5.1)
Sodium: 140 mEq/L (ref 135–145)

## 2015-07-06 LAB — HIV ANTIBODY (ROUTINE TESTING W REFLEX): HIV 1&2 Ab, 4th Generation: NONREACTIVE

## 2015-07-06 LAB — HEMOGLOBIN: Hemoglobin: 11.9 g/dL — ABNORMAL LOW (ref 12.0–15.0)

## 2015-07-06 LAB — IRON: Iron: 65 ug/dL (ref 42–145)

## 2015-07-06 LAB — FERRITIN: Ferritin: 20.5 ng/mL (ref 10.0–291.0)

## 2015-07-12 NOTE — Addendum Note (Signed)
Addended by: Wilfrid Lund on: 07/12/2015 10:15 AM   Modules accepted: Orders

## 2015-08-29 ENCOUNTER — Telehealth: Payer: Self-pay | Admitting: Internal Medicine

## 2015-08-29 MED ORDER — CLORAZEPATE DIPOTASSIUM 7.5 MG PO TABS: 7.5000 mg | ORAL_TABLET | Freq: Three times a day (TID) | ORAL | Status: DC | PRN

## 2015-08-29 NOTE — Telephone Encounter (Signed)
Rx faxed to [facility] pharmacy.  

## 2015-08-29 NOTE — Telephone Encounter (Signed)
Pt is requesting refill on Clorazepate.    Last OV: 07/05/2015  Last Fill: 06/07/2015 #90 and 1RF. Pt can take 1 tab tid prn UDS: 04/13/2015 Low risk  Please advise.

## 2015-08-29 NOTE — Telephone Encounter (Signed)
Okay 90 and one refill 

## 2015-08-29 NOTE — Telephone Encounter (Signed)
Caller name: Trace Cederberg   Relationship to patient: Self   Can be reached: [phone]  Pharmacy: [id number] - [place], [place] - [address] AT [address]  Reason for call: Pt would like a refill on her clorazepate Rx.

## 2015-08-29 NOTE — Telephone Encounter (Signed)
Rx printed, awaiting MD signature.  

## 2015-09-28 ENCOUNTER — Telehealth: Payer: Self-pay | Admitting: Internal Medicine

## 2015-09-28 NOTE — Telephone Encounter (Signed)
Pt called stating that she is faxing form to [place] that her employer requires to save money on health care premiums. It is called "[employer] Check Form - Biometric Screening". She said the fax # is on the form where is needs returned. It needs to be returned by 09/30/15. She is faxing now. Please fax back to her employer and notify her asap @ [phone]. She is faxing to attn: Jessica/Kaylyn.

## 2015-09-28 NOTE — Telephone Encounter (Signed)
Awaiting form

## 2015-09-28 NOTE — Telephone Encounter (Signed)
Form signed and faxed to [employer] at [phone]. Spoke with Pt and informed her that form has been faxed as requested. Pt verbalized understanding. Form sent for scanning.

## 2015-09-28 NOTE — Telephone Encounter (Signed)
Received form, completed and given to Dr. Larose Kells for signing.

## 2015-09-28 NOTE — Telephone Encounter (Signed)
Received fax confirmation on 09/28/2015 at 4:49 PM.

## 2015-09-29 ENCOUNTER — Encounter: Payer: Self-pay | Admitting: Internal Medicine

## 2015-09-30 ENCOUNTER — Telehealth: Payer: Self-pay

## 2015-09-30 DIAGNOSIS — N289 Disorder of kidney and ureter, unspecified: Secondary | ICD-10-CM

## 2015-09-30 NOTE — Telephone Encounter (Signed)
Form faxed to [phone] as requested.

## 2015-09-30 NOTE — Telephone Encounter (Signed)
Received fax confirmation on 09/30/2015 at 11:44 AM.

## 2015-09-30 NOTE — Telephone Encounter (Signed)
-----   Message from Colon Branch, MD sent at 09/29/2015  7:27 PM EST ----- Regarding: Needs a letter (please put something in "reason for letter" on every letter like "reminder for [place]") Send a letter Kimberly Valenzuela, we have not been able to communicate with you, you need to get a ultrasound of the kidney. Please contact the office at your earliest convenience

## 2015-09-30 NOTE — Telephone Encounter (Signed)
Pt would like copy faxed directly to her today asap at [phone]. She said that [employer] has not posted online in her chart and if not uploaded today she will be charged extra.

## 2015-09-30 NOTE — Telephone Encounter (Signed)
Letter printed and mailed to Pt.  

## 2015-10-11 ENCOUNTER — Other Ambulatory Visit: Payer: Self-pay

## 2015-10-11 NOTE — Addendum Note (Signed)
Addended by: Wilfrid Lund on: 10/11/2015 02:02 PM   Modules accepted: Orders

## 2015-10-11 NOTE — Telephone Encounter (Signed)
US Renal reordered.

## 2015-10-12 ENCOUNTER — Ambulatory Visit (HOSPITAL_BASED_OUTPATIENT_CLINIC_OR_DEPARTMENT_OTHER)
Admission: RE | Admit: 2015-10-12 | Discharge: 2015-10-12 | Disposition: A | Discharge status: Home / Self Care | Payer: BLUE CROSS/BLUE SHIELD | Source: Ambulatory Visit | Attending: Internal Medicine | Admitting: Internal Medicine

## 2015-10-12 DIAGNOSIS — N289 Disorder of kidney and ureter, unspecified: Secondary | ICD-10-CM | POA: Insufficient documentation

## 2015-12-27 ENCOUNTER — Telehealth: Payer: Self-pay | Admitting: *Deleted

## 2015-12-27 NOTE — Telephone Encounter (Signed)
Unable to complete pre-visit info as pt was at the dentist's office at time of call. Appt confirmed w/ pt.

## 2015-12-28 ENCOUNTER — Ambulatory Visit (INDEPENDENT_AMBULATORY_CARE_PROVIDER_SITE_OTHER): Payer: BLUE CROSS/BLUE SHIELD | Admitting: Internal Medicine

## 2015-12-28 ENCOUNTER — Encounter: Payer: Self-pay | Admitting: Internal Medicine

## 2015-12-28 VITALS — BP 118/74 | HR 58 | Temp 97.8°F | Ht 63.0 in | Wt 120.0 lb

## 2015-12-28 DIAGNOSIS — D649 Anemia, unspecified: Secondary | ICD-10-CM

## 2015-12-28 DIAGNOSIS — Z0000 Encounter for general adult medical examination without abnormal findings: Secondary | ICD-10-CM

## 2015-12-28 DIAGNOSIS — I10 Essential (primary) hypertension: Secondary | ICD-10-CM | POA: Diagnosis not present

## 2015-12-28 DIAGNOSIS — M810 Age-related osteoporosis without current pathological fracture: Secondary | ICD-10-CM

## 2015-12-28 DIAGNOSIS — E785 Hyperlipidemia, unspecified: Secondary | ICD-10-CM

## 2015-12-28 LAB — COMPLETE METABOLIC PANEL WITH GFR
ALT: 30 U/L — ABNORMAL HIGH (ref 6–29)
AST: 27 U/L (ref 10–35)
Albumin: 4.1 g/dL (ref 3.6–5.1)
Alkaline Phosphatase: 47 U/L (ref 33–130)
BUN: 19 mg/dL (ref 7–25)
CO2: 22 mmol/L (ref 20–31)
Calcium: 9.6 mg/dL (ref 8.6–10.4)
Chloride: 105 mmol/L (ref 98–110)
Creat: 1.45 mg/dL — ABNORMAL HIGH (ref 0.50–1.05)
GFR, Est African American: 45 mL/min — ABNORMAL LOW (ref >=60)
GFR, Est Non African American: 39 mL/min — ABNORMAL LOW (ref >=60)
Glucose, Bld: 62 mg/dL — ABNORMAL LOW (ref 65–99)
Potassium: 4.3 mmol/L (ref 3.5–5.3)
Sodium: 139 mmol/L (ref 135–146)
Total Bilirubin: 0.3 mg/dL (ref 0.2–1.2)
Total Protein: 6.3 g/dL (ref 6.1–8.1)

## 2015-12-28 MED ORDER — CLORAZEPATE DIPOTASSIUM 7.5 MG PO TABS: 7.5000 mg | ORAL_TABLET | Freq: Three times a day (TID) | ORAL | Status: DC | PRN

## 2015-12-28 MED ORDER — LISINOPRIL-HYDROCHLOROTHIAZIDE 10-12.5 MG PO TABS: 1.0000 | ORAL_TABLET | Freq: Every day | ORAL | Status: DC

## 2015-12-28 NOTE — Progress Notes (Signed)
Pre visit review using our clinic review tool, if applicable. No additional management support is needed unless otherwise documented below in the visit note. 

## 2015-12-28 NOTE — Progress Notes (Signed)
Subjective:    Patient ID: Kimberly Valenzuela, female    DOB: 08-21-1956, 60 y.o.   MRN: [id number]  DOS:  12/28/2015 Type of visit - description : CPX Interval history: Feeling well, no major concerns, taking medication as prescribed   Review of Systems Constitutional: No fever. No chills. No unexplained wt changes. No unusual sweats  HEENT: No dental problems, no ear discharge, no facial swelling, no voice changes. No eye discharge, no eye  redness , no  intolerance to light   Respiratory: No wheezing , no  difficulty breathing. No cough , no mucus production  Cardiovascular: No CP, no leg swelling , no  Palpitations  GI: no nausea, no vomiting, no diarrhea , no  abdominal pain.  No blood in the stools. No dysphagia, no odynophagia    Endocrine: No polyphagia, no polyuria , no polydipsia  GU: No dysuria, gross hematuria, difficulty urinating. No urinary urgency, no frequency.  Musculoskeletal: No joint swellings or unusual aches or pains  Skin: No change in the color of the skin, palor , no  Rash  Allergic, immunologic: No environmental allergies , no  food allergies  Neurological: No dizziness no  syncope. No headaches. No diplopia, no slurred, no slurred speech, no motor deficits, no facial  Numbness  Hematological: No enlarged lymph nodes, no easy bruising , no unusual bleedings  Psychiatry: No suicidal ideas, no hallucinations, no beavior problems, no confusion.  No unusual/severe anxiety, no depression   Past Medical History  Diagnosis Date  . Hypertension   . Dyslipidemia     high TG , dx ~ 01-2011  . Menopause     after BCP were d/c ~ 2010  . Anxiety 08/03/2011  . Back pain 2015    MRI, local injection, better after injection    Past Surgical History  Procedure Laterality Date  . Cesarean section      Social History   Social History  . Marital Status: Married    Spouse Name: N/A  . Number of Children: 1  . Years of Education: N/A   Occupational  History  . sales     Social History Main Topics  . Smoking status: Current Every Day Smoker -- 0.50 packs/day for 34 years    Types: Cigarettes  . Smokeless tobacco: Never Used     Comment: ~ 1/2 ppd  . Alcohol Use: 4.2 oz/week    7 Glasses of wine per week     Comment: socially , wine  . Drug Use: 2.00 per week    Special: Marijuana  . Sexual Activity: Not on file   Other Topics Concern  . Not on file   Social History Narrative   Lost parents 2014   Lives w/ husband who has a ETOH issue     Family History  Problem Relation Age of Onset  . Diabetes Neg Hx   . Coronary artery disease Neg Hx   . Stroke Neg Hx   . Breast cancer Neg Hx   . Ovarian cancer Mother 62  . Colon cancer Maternal Grandmother     dx in her 17s  . Renal cancer Maternal Grandfather     dx in his 72s  . Lung cancer Maternal Uncle     smoker       Medication List       This list is accurate as of: 12/28/15 11:59 PM.  Always use your most recent med list.  clorazepate 7.5 MG tablet  Commonly known as:  TRANXENE  Take 1 tablet (7.5 mg total) by mouth 3 (three) times daily as needed.     ibandronate 150 MG tablet  Commonly known as:  BONIVA  SEE NOTES     lisinopril-hydrochlorothiazide 10-12.5 MG tablet  Commonly known as:  PRINZIDE,ZESTORETIC  Take 1 tablet by mouth daily.     ONE-A-DAY WOMENS FORMULA Tabs  Take 1 each by mouth daily. Womens Menopause Formula     Vitamin D 1000 units capsule  Take 1,000 Units by mouth daily.           Objective:   Physical Exam BP 118/74 mmHg  Pulse 58  Temp(Src) 97.8 F (36.6 C) (Oral)  Ht 5\' 3"  (1.6 m)  Wt 120 lb (54.432 kg)  BMI 21.26 kg/m2  SpO2 99% General:   Well developed, well nourished . NAD.  HEENT:  Normocephalic . Face symmetric, atraumatic. Neck: No thyromegaly Lungs:  CTA B Normal respiratory effort, no intercostal retractions, no accessory muscle use. Heart: RRR,  no murmur.  no pretibial edema  bilaterally  Abdomen:  Not distended, soft, non-tender. No rebound or rigidity.   Skin: Not pale. Not jaundice Neurologic:  alert & oriented X3.  Speech normal, gait appropriate for age and unassisted Psych--  Cognition and judgment appear intact.  Cooperative with normal attention span and concentration.  Behavior appropriate. No anxious or depressed appearing.      Assessment & Plan:   Assessment HTN Increase creatinine: Baseline 1.2 >>> 1.59 (06-2015), wnl renal US (09-2015) Dyslipidemia Anxiety - tranxene  Back pain, 2015, had a MRI, improve after a local injection H/o iron deficiency anemia , declined GI 2016  Osteoporosis T score -2.8 (2013), unable to rx reclast --> started Boniva 2014 Menopause after BCP d/c  2010 Lipoma R upper back +FH ovarian cancer, GM and mother. Had genetic testing  2014 ---> negative   PLAN: HTN: Well-controlled, continue lisinopril-HCT Increase creatinine: Recheck in a BMP today Dyslipidemia: On diet control, checking labs today Psych: Well-controlled, refill Tranxene Iron deficiency anemia: Recheck labs, previously declined a GI referral. Not on iron supplements.  Osteoporosis: Recheck a bone density test, sometimes forgets Boniva ---> Reclast? RTC 6 months

## 2015-12-28 NOTE — Assessment & Plan Note (Addendum)
Tdap 2012 Zostavax discussed before  Flu shot -- declined pnm shot (smoker) -- declined   Cscope 04-2012--->  Polyps, 60 years  Female care  Last MMG 04-2015 negative H/o abnormal PAP x 1 in the 90s, f/u by normal ones, PAP 2013 (-) , PAP (-) 02-2015   Diet-exercise discussed

## 2015-12-28 NOTE — Patient Instructions (Signed)
GO TO THE LAB : Get the blood work    GO TO THE FRONT DESK Schedule a routine office visit or check up to be done in  6 months  No fasting       Steps to Quit Smoking  Smoking tobacco can be harmful to your health and can affect almost every organ in your body. Smoking puts you, and those around you, at risk for developing many serious chronic diseases. Quitting smoking is difficult, but it is one of the best things that you can do for your health. It is never too late to quit. WHAT ARE THE BENEFITS OF QUITTING SMOKING? When you quit smoking, you lower your risk of developing serious diseases and conditions, such as:  Lung cancer or lung disease, such as COPD.  Heart disease.  Stroke.  Heart attack.  Infertility.  Osteoporosis and bone fractures. Additionally, symptoms such as coughing, wheezing, and shortness of breath may get better when you quit. You may also find that you get sick less often because your body is stronger at fighting off colds and infections. If you are pregnant, quitting smoking can help to reduce your chances of having a baby of low birth weight. HOW DO I GET READY TO QUIT? When you decide to quit smoking, create a plan to make sure that you are successful. Before you quit:  Pick a date to quit. Set a date within the next two weeks to give you time to prepare.  Write down the reasons why you are quitting. Keep this list in places where you will see it often, such as on your bathroom mirror or in your car or wallet.  Identify the people, places, things, and activities that make you want to smoke (triggers) and avoid them. Make sure to take these actions:  Throw away all cigarettes at home, at work, and in your car.  Throw away smoking accessories, such as Scientist, research (medical).  Clean your car and make sure to empty the ashtray.  Clean your home, including curtains and carpets.  Tell your family, friends, and coworkers that you are quitting. Support  from your loved ones can make quitting easier.  Talk with your health care provider about your options for quitting smoking.  Find out what treatment options are covered by your health insurance. WHAT STRATEGIES CAN I USE TO QUIT SMOKING?  Talk with your healthcare provider about different strategies to quit smoking. Some strategies include:  Quitting smoking altogether instead of gradually lessening how much you smoke over a period of time. Research shows that quitting "cold [place]" is more successful than gradually quitting.  Attending in-person counseling to help you build problem-solving skills. You are more likely to have success in quitting if you attend several counseling sessions. Even short sessions of 10 minutes can be effective.  Finding resources and support systems that can help you to quit smoking and remain smoke-free after you quit. These resources are most helpful when you use them often. They can include:  Online chats with a Social worker.  Telephone quitlines.  Printed Furniture conservator/restorer.  Support groups or group counseling.  Text messaging programs.  Mobile phone applications.  Taking medicines to help you quit smoking. (If you are pregnant or breastfeeding, talk with your health care provider first.) Some medicines contain nicotine and some do not. Both types of medicines help with cravings, but the medicines that include nicotine help to relieve withdrawal symptoms. Your health care provider may recommend:  Nicotine patches, gum,  or lozenges.  Nicotine inhalers or sprays.  Non-nicotine medicine that is taken by mouth. Talk with your health care provider about combining strategies, such as taking medicines while you are also receiving in-person counseling. Using these two strategies together makes you more likely to succeed in quitting than if you used either strategy on its own. If you are pregnant or breastfeeding, talk with your health care provider about  finding counseling or other support strategies to quit smoking. Do not take medicine to help you quit smoking unless told to do so by your health care provider. WHAT THINGS CAN I DO TO MAKE IT EASIER TO QUIT? Quitting smoking might feel overwhelming at first, but there is a lot that you can do to make it easier. Take these important actions:  Reach out to your family and friends and ask that they support and encourage you during this time. Call telephone quitlines, reach out to support groups, or work with a counselor for support.  Ask people who smoke to avoid smoking around you.  Avoid places that trigger you to smoke, such as bars, parties, or smoke-break areas at work.  Spend time around people who do not smoke.  Lessen stress in your life, because stress can be a smoking trigger for some people. To lessen stress, try:  Exercising regularly.  Deep-breathing exercises.  Yoga.  Meditating.  Performing a body scan. This involves closing your eyes, scanning your body from head to toe, and noticing which parts of your body are particularly tense. Purposefully relax the muscles in those areas.  Download or purchase mobile phone or tablet apps (applications) that can help you stick to your quit plan by providing reminders, tips, and encouragement. There are many free apps, such as QuitGuide from the [employer] Office manager for Disease Control and Prevention). You can find other support for quitting smoking (smoking cessation) through smokefree.gov and other websites. HOW WILL I FEEL WHEN I QUIT SMOKING? Within the first 24 hours of quitting smoking, you may start to feel some withdrawal symptoms. These symptoms are usually most noticeable 2-3 days after quitting, but they usually do not last beyond 2-3 weeks. Changes or symptoms that you might experience include:  Mood swings.  Restlessness, anxiety, or irritation.  Difficulty concentrating.  Dizziness.  Strong cravings for sugary foods in  addition to nicotine.  Mild weight gain.  Constipation.  Nausea.  Coughing or a sore throat.  Changes in how your medicines work in your body.  A depressed mood.  Difficulty sleeping (insomnia). After the first 2-3 weeks of quitting, you may start to notice more positive results, such as:  Improved sense of smell and taste.  Decreased coughing and sore throat.  Slower heart rate.  Lower blood pressure.  Clearer skin.  The ability to breathe more easily.  Fewer sick days. Quitting smoking is very challenging for most people. Do not get discouraged if you are not successful the first time. Some people need to make many attempts to quit before they achieve long-term success. Do your best to stick to your quit plan, and talk with your health care provider if you have any questions or concerns.   This information is not intended to replace advice given to you by your health care provider. Make sure you discuss any questions you have with your health care provider.   Document Released: 10/30/2001 Document Revised: 03/22/2015 Document Reviewed: 03/22/2015 Elsevier Interactive Patient Education [employer].

## 2015-12-29 LAB — CBC WITH DIFFERENTIAL/PLATELET
Basophils Absolute: 0 10*3/uL (ref 0.0–0.1)
Basophils Relative: 0.5 % (ref 0.0–3.0)
Eosinophils Absolute: 0.2 10*3/uL (ref 0.0–0.7)
Eosinophils Relative: 2.3 % (ref 0.0–5.0)
HCT: 34.9 % — ABNORMAL LOW (ref 36.0–46.0)
Hemoglobin: 11.6 g/dL — ABNORMAL LOW (ref 12.0–15.0)
Lymphocytes Relative: 43.5 % (ref 12.0–46.0)
Lymphs Abs: 2.9 10*3/uL (ref 0.7–4.0)
MCHC: 33.3 g/dL (ref 30.0–36.0)
MCV: 96.9 fl (ref 78.0–100.0)
Monocytes Absolute: 0.3 10*3/uL (ref 0.1–1.0)
Monocytes Relative: 4.8 % (ref 3.0–12.0)
Neutro Abs: 3.3 10*3/uL (ref 1.4–7.7)
Neutrophils Relative %: 48.9 % (ref 43.0–77.0)
Platelets: 224 10*3/uL (ref 150.0–400.0)
RBC: 3.6 Mil/uL — ABNORMAL LOW (ref 3.87–5.11)
RDW: 13.9 % (ref 11.5–15.5)
WBC: 6.8 10*3/uL (ref 4.0–10.5)

## 2015-12-29 LAB — FERRITIN: Ferritin: 18.1 ng/mL (ref 10.0–291.0)

## 2015-12-29 LAB — LIPID PANEL
Cholesterol: 188 mg/dL (ref 0–200)
HDL: 53.5 mg/dL (ref >39.00)
LDL Cholesterol: 112 mg/dL — ABNORMAL HIGH (ref 0–99)
NonHDL: 134.8
Total CHOL/HDL Ratio: 4
Triglycerides: 116 mg/dL (ref 0.0–149.0)
VLDL: 23.2 mg/dL (ref 0.0–40.0)

## 2015-12-29 LAB — TSH: TSH: 0.89 u[IU]/mL (ref 0.35–4.50)

## 2015-12-29 LAB — IRON: Iron: 106 ug/dL (ref 42–145)

## 2016-02-14 ENCOUNTER — Other Ambulatory Visit (HOSPITAL_BASED_OUTPATIENT_CLINIC_OR_DEPARTMENT_OTHER): Payer: BLUE CROSS/BLUE SHIELD

## 2016-02-16 ENCOUNTER — Other Ambulatory Visit (HOSPITAL_BASED_OUTPATIENT_CLINIC_OR_DEPARTMENT_OTHER): Payer: BLUE CROSS/BLUE SHIELD

## 2016-04-03 ENCOUNTER — Ambulatory Visit (HOSPITAL_BASED_OUTPATIENT_CLINIC_OR_DEPARTMENT_OTHER)
Admission: RE | Admit: 2016-04-03 | Discharge: 2016-04-03 | Disposition: A | Discharge status: Home / Self Care | Payer: BLUE CROSS/BLUE SHIELD | Source: Ambulatory Visit | Attending: Internal Medicine | Admitting: Internal Medicine

## 2016-04-03 DIAGNOSIS — M810 Age-related osteoporosis without current pathological fracture: Secondary | ICD-10-CM | POA: Diagnosis not present

## 2016-04-03 DIAGNOSIS — Z780 Asymptomatic menopausal state: Secondary | ICD-10-CM | POA: Insufficient documentation

## 2016-04-06 ENCOUNTER — Telehealth: Payer: Self-pay

## 2016-04-06 NOTE — Telephone Encounter (Signed)
Prolia insurance verification completed via online portal. Insurance Verification request form sent for scanning.

## 2016-04-10 NOTE — Telephone Encounter (Signed)
Received Summary of benefits for Prolia. Coverage as stated below:  Provider is in network for patients plan. Whether an office visit is billed or not, the patient will be responsible for a $25 co-pay which will cover Prolia, administration, and the office visit. Co-pays contribute to a $4300 out of pocket max ($48.16 met). Once OOP is met, co-pay will be waived. PA is required and not on file for medication.

## 2016-04-10 NOTE — Telephone Encounter (Signed)
Prolia PA form completed and faxed to [phone] w/ OV notes from 12/28/2015 and Bone density results from 04/03/2016.

## 2016-04-11 NOTE — Addendum Note (Signed)
Addended by: Damita Dunnings D on: 04/11/2016 08:43 AM   Modules accepted: Orders, Medications

## 2016-04-20 ENCOUNTER — Telehealth: Payer: Self-pay | Admitting: Internal Medicine

## 2016-04-20 NOTE — Telephone Encounter (Signed)
Called to inform that no PA is needed for Prolia.   Information from Nurse Odie Sera. Reference #  [id number]

## 2016-04-20 NOTE — Telephone Encounter (Signed)
Noted  

## 2016-04-20 NOTE — Telephone Encounter (Signed)
Call Documentation      Kimberly Valenzuela at 04/20/2016 12:00 PM     Status: Signed       Expand All Collapse All   Called to inform that no PA is needed for Prolia.   Information from Nurse Odie Sera. Reference # [id number]

## 2016-06-04 ENCOUNTER — Encounter: Payer: Self-pay | Admitting: Internal Medicine

## 2016-06-04 ENCOUNTER — Ambulatory Visit (INDEPENDENT_AMBULATORY_CARE_PROVIDER_SITE_OTHER): Payer: BLUE CROSS/BLUE SHIELD | Admitting: Internal Medicine

## 2016-06-04 VITALS — BP 110/76 | HR 54 | Temp 97.6°F | Ht 63.0 in | Wt 117.0 lb

## 2016-06-04 DIAGNOSIS — Z09 Encounter for follow-up examination after completed treatment for conditions other than malignant neoplasm: Secondary | ICD-10-CM | POA: Diagnosis not present

## 2016-06-04 DIAGNOSIS — M810 Age-related osteoporosis without current pathological fracture: Secondary | ICD-10-CM | POA: Diagnosis not present

## 2016-06-04 DIAGNOSIS — I10 Essential (primary) hypertension: Secondary | ICD-10-CM | POA: Diagnosis not present

## 2016-06-04 LAB — BASIC METABOLIC PANEL
BUN: 29 mg/dL — ABNORMAL HIGH (ref 6–23)
CO2: 28 mEq/L (ref 19–32)
Calcium: 10.1 mg/dL (ref 8.4–10.5)
Chloride: 106 mEq/L (ref 96–112)
Creatinine, Ser: 1.62 mg/dL — ABNORMAL HIGH (ref 0.40–1.20)
GFR: 34.47 mL/min — ABNORMAL LOW (ref >60.00)
Glucose, Bld: 75 mg/dL (ref 70–99)
Potassium: 4.5 mEq/L (ref 3.5–5.1)
Sodium: 140 mEq/L (ref 135–145)

## 2016-06-04 MED ORDER — LISINOPRIL-HYDROCHLOROTHIAZIDE 10-12.5 MG PO TABS: 1.0000 | ORAL_TABLET | Freq: Every day | ORAL | Status: DC

## 2016-06-04 MED ORDER — DENOSUMAB 60 MG/ML ~~LOC~~ SOLN
60.0000 mg | Freq: Once | SUBCUTANEOUS | Status: AC
  Administered 2016-06-04: 60 mg via SUBCUTANEOUS

## 2016-06-04 MED ORDER — CLORAZEPATE DIPOTASSIUM 7.5 MG PO TABS: 7.5000 mg | ORAL_TABLET | Freq: Three times a day (TID) | ORAL | Status: DC | PRN

## 2016-06-04 NOTE — Progress Notes (Signed)
Pre visit review using our clinic review tool, if applicable. No additional management support is needed unless otherwise documented below in the visit note. 

## 2016-06-04 NOTE — Progress Notes (Signed)
Subjective:    Patient ID: Kimberly Valenzuela, female    DOB: Nov 09, 1956, 60 y.o.   MRN: [id number]  DOS:  06/04/2016 Type of visit - description : rov Interval history: In general feeling well. Good medication compliance Start prolia?   Review of Systems Denies chest pain or difficulty breathing No nausea, vomiting, diarrhea  Past Medical History  Diagnosis Date  . Hypertension   . Dyslipidemia     high TG , dx ~ 01-2011  . Menopause     after BCP were d/c ~ 2010  . Anxiety 08/03/2011  . Back pain 2015    MRI, local injection, better after injection    Past Surgical History  Procedure Laterality Date  . Cesarean section      Social History   Social History  . Marital Status: Married    Spouse Name: N/A  . Number of Children: 1  . Years of Education: N/A   Occupational History  . sales     Social History Main Topics  . Smoking status: Current Every Day Smoker -- 0.50 packs/day for 34 years    Types: Cigarettes  . Smokeless tobacco: Never Used     Comment: ~ 1/2 ppd  . Alcohol Use: 4.2 oz/week    7 Glasses of wine per week     Comment: socially , wine  . Drug Use: 2.00 per week    Special: Marijuana  . Sexual Activity: Not on file   Other Topics Concern  . Not on file   Social History Narrative   Lost parents 2014   Lives w/ husband who has a ETOH issue        Medication List       This list is accurate as of: 06/04/16 11:59 PM.  Always use your most recent med list.               clorazepate 7.5 MG tablet  Commonly known as:  TRANXENE  Take 1 tablet (7.5 mg total) by mouth 3 (three) times daily as needed.     lisinopril-hydrochlorothiazide 10-12.5 MG tablet  Commonly known as:  PRINZIDE,ZESTORETIC  Take 1 tablet by mouth daily.     ONE-A-DAY WOMENS FORMULA Tabs  Take 1 each by mouth daily. Womens Menopause Formula     Vitamin D 1000 units capsule  Take 1,000 Units by mouth daily.           Objective:   Physical Exam BP 110/76  mmHg  Pulse 54  Temp(Src) 97.6 F (36.4 C) (Oral)  Ht 5\' 3"  (1.6 m)  Wt 117 lb (53.071 kg)  BMI 20.73 kg/m2  SpO2 99% General:   Well developed, well nourished . NAD.  HEENT:  Normocephalic . Face symmetric, atraumatic Lungs:  CTA B Normal respiratory effort, no intercostal retractions, no accessory muscle use. Heart: RRR,  no murmur.  No pretibial edema bilaterally  Skin: Not pale. Not jaundice Neurologic:  alert & oriented X3.  Speech normal, gait appropriate for age and unassisted Psych--  Cognition and judgment appear intact.  Cooperative with normal attention span and concentration.  Behavior appropriate. No anxious or depressed appearing.      Assessment & Plan:   Assessment HTN Increase creatinine: Baseline 1.2 >>> 1.59 (06-2015), wnl renal US (09-2015) Dyslipidemia Anxiety - tranxene  Back pain, 2015, had a MRI, improve after a local injection H/o iron deficiency anemia , declined GI 2016  Osteoporosis  --T score -2.8 (2013), unable to rx reclast -->  started Boniva 2014 --T score -3.4 (03-2016)  worse d/t poor compliance versus no response to Boniva, Rx PROLIA, first dose7-17-17 Menopause after BCP d/c  2010 Lipoma R upper back +FH ovarian cancer, GM and mother. Had genetic testing  2014 ---> negative   PLAN: HTN: Controlled, continue lisinopril HCT, check a BMP Anxiety: RF meds Osteoporosis: Last T score worse at -3.4, either due to poor compliance versus no response to Boniva. We agreed on the start [place] first dose today, also encouraged to stay active, take calcium and vitamin D. RTC 12-2016, CPX

## 2016-06-04 NOTE — Patient Instructions (Signed)
GO TO THE LAB : Get the blood work     GO TO THE FRONT DESK Schedule your next appointment for a physical exam by 12-2016  Vitamin D 1000 units a day

## 2016-06-05 DIAGNOSIS — Z09 Encounter for follow-up examination after completed treatment for conditions other than malignant neoplasm: Secondary | ICD-10-CM | POA: Insufficient documentation

## 2016-06-05 NOTE — Assessment & Plan Note (Addendum)
HTN: Controlled, continue lisinopril HCT, check a BMP Anxiety: RF meds Osteoporosis: Last T score worse at -3.4, either due to poor compliance versus no response to Boniva. We agreed on the start [place] first dose today, also encouraged to stay active, take calcium and vitamin D. RTC 12-2016, CPX

## 2016-06-29 ENCOUNTER — Ambulatory Visit: Payer: BLUE CROSS/BLUE SHIELD | Admitting: Internal Medicine

## 2016-12-12 ENCOUNTER — Telehealth: Payer: Self-pay | Admitting: Internal Medicine

## 2016-12-12 NOTE — Telephone Encounter (Signed)
Prolia benefits verified PA required thru [place] Patient will owe $25 copay at time of the injection to cover admin fee and Prolia  PA form completed and faxed to [phone] 12/12/16

## 2016-12-18 DIAGNOSIS — C61 Malignant neoplasm of prostate: Secondary | ICD-10-CM | POA: Diagnosis not present

## 2016-12-18 DIAGNOSIS — M109 Gout, unspecified: Secondary | ICD-10-CM | POA: Diagnosis not present

## 2016-12-18 DIAGNOSIS — Z7982 Long term (current) use of aspirin: Secondary | ICD-10-CM | POA: Diagnosis not present

## 2016-12-18 DIAGNOSIS — Z8719 Personal history of other diseases of the digestive system: Secondary | ICD-10-CM | POA: Diagnosis not present

## 2016-12-18 DIAGNOSIS — Z87891 Personal history of nicotine dependence: Secondary | ICD-10-CM | POA: Diagnosis not present

## 2016-12-18 DIAGNOSIS — K219 Gastro-esophageal reflux disease without esophagitis: Secondary | ICD-10-CM | POA: Diagnosis not present

## 2016-12-18 DIAGNOSIS — Z8249 Family history of ischemic heart disease and other diseases of the circulatory system: Secondary | ICD-10-CM | POA: Diagnosis not present

## 2016-12-18 DIAGNOSIS — I2510 Atherosclerotic heart disease of native coronary artery without angina pectoris: Secondary | ICD-10-CM | POA: Diagnosis not present

## 2016-12-18 DIAGNOSIS — H04129 Dry eye syndrome of unspecified lacrimal gland: Secondary | ICD-10-CM | POA: Diagnosis not present

## 2016-12-18 DIAGNOSIS — I739 Peripheral vascular disease, unspecified: Secondary | ICD-10-CM | POA: Diagnosis not present

## 2016-12-18 DIAGNOSIS — I10 Essential (primary) hypertension: Secondary | ICD-10-CM | POA: Diagnosis not present

## 2016-12-18 DIAGNOSIS — E7800 Pure hypercholesterolemia, unspecified: Secondary | ICD-10-CM | POA: Diagnosis not present

## 2016-12-20 DIAGNOSIS — K219 Gastro-esophageal reflux disease without esophagitis: Secondary | ICD-10-CM | POA: Diagnosis not present

## 2016-12-20 DIAGNOSIS — I739 Peripheral vascular disease, unspecified: Secondary | ICD-10-CM | POA: Diagnosis not present

## 2016-12-20 DIAGNOSIS — C61 Malignant neoplasm of prostate: Secondary | ICD-10-CM | POA: Diagnosis not present

## 2016-12-20 DIAGNOSIS — Z8719 Personal history of other diseases of the digestive system: Secondary | ICD-10-CM | POA: Diagnosis not present

## 2016-12-20 DIAGNOSIS — Z87891 Personal history of nicotine dependence: Secondary | ICD-10-CM | POA: Diagnosis not present

## 2016-12-20 DIAGNOSIS — Z8249 Family history of ischemic heart disease and other diseases of the circulatory system: Secondary | ICD-10-CM | POA: Diagnosis not present

## 2016-12-20 DIAGNOSIS — I2510 Atherosclerotic heart disease of native coronary artery without angina pectoris: Secondary | ICD-10-CM | POA: Diagnosis not present

## 2016-12-20 DIAGNOSIS — E7800 Pure hypercholesterolemia, unspecified: Secondary | ICD-10-CM | POA: Diagnosis not present

## 2016-12-20 DIAGNOSIS — I10 Essential (primary) hypertension: Secondary | ICD-10-CM | POA: Diagnosis not present

## 2016-12-20 DIAGNOSIS — H04129 Dry eye syndrome of unspecified lacrimal gland: Secondary | ICD-10-CM | POA: Diagnosis not present

## 2016-12-20 DIAGNOSIS — M109 Gout, unspecified: Secondary | ICD-10-CM | POA: Diagnosis not present

## 2016-12-20 DIAGNOSIS — Z7982 Long term (current) use of aspirin: Secondary | ICD-10-CM | POA: Diagnosis not present

## 2016-12-21 DIAGNOSIS — I10 Essential (primary) hypertension: Secondary | ICD-10-CM | POA: Diagnosis not present

## 2016-12-21 DIAGNOSIS — C61 Malignant neoplasm of prostate: Secondary | ICD-10-CM | POA: Diagnosis not present

## 2016-12-21 DIAGNOSIS — Z8719 Personal history of other diseases of the digestive system: Secondary | ICD-10-CM | POA: Diagnosis not present

## 2016-12-21 DIAGNOSIS — M109 Gout, unspecified: Secondary | ICD-10-CM | POA: Diagnosis not present

## 2016-12-21 DIAGNOSIS — I2510 Atherosclerotic heart disease of native coronary artery without angina pectoris: Secondary | ICD-10-CM | POA: Diagnosis not present

## 2016-12-21 DIAGNOSIS — H04129 Dry eye syndrome of unspecified lacrimal gland: Secondary | ICD-10-CM | POA: Diagnosis not present

## 2016-12-21 DIAGNOSIS — Z8249 Family history of ischemic heart disease and other diseases of the circulatory system: Secondary | ICD-10-CM | POA: Diagnosis not present

## 2016-12-21 DIAGNOSIS — Z87891 Personal history of nicotine dependence: Secondary | ICD-10-CM | POA: Diagnosis not present

## 2016-12-21 DIAGNOSIS — K219 Gastro-esophageal reflux disease without esophagitis: Secondary | ICD-10-CM | POA: Diagnosis not present

## 2016-12-21 DIAGNOSIS — I739 Peripheral vascular disease, unspecified: Secondary | ICD-10-CM | POA: Diagnosis not present

## 2016-12-21 DIAGNOSIS — E7800 Pure hypercholesterolemia, unspecified: Secondary | ICD-10-CM | POA: Diagnosis not present

## 2016-12-21 DIAGNOSIS — Z7982 Long term (current) use of aspirin: Secondary | ICD-10-CM | POA: Diagnosis not present

## 2016-12-27 ENCOUNTER — Other Ambulatory Visit: Payer: Self-pay | Admitting: Internal Medicine

## 2017-01-01 ENCOUNTER — Encounter: Payer: Self-pay | Admitting: Internal Medicine

## 2017-01-01 ENCOUNTER — Ambulatory Visit (INDEPENDENT_AMBULATORY_CARE_PROVIDER_SITE_OTHER): Payer: BLUE CROSS/BLUE SHIELD | Admitting: Internal Medicine

## 2017-01-01 VITALS — BP 108/76 | HR 76 | Temp 98.2°F | Resp 12 | Ht 63.0 in | Wt 125.4 lb

## 2017-01-01 DIAGNOSIS — M810 Age-related osteoporosis without current pathological fracture: Secondary | ICD-10-CM

## 2017-01-01 DIAGNOSIS — Z0000 Encounter for general adult medical examination without abnormal findings: Secondary | ICD-10-CM

## 2017-01-01 LAB — BASIC METABOLIC PANEL
BUN: 33 mg/dL — ABNORMAL HIGH (ref 6–23)
CO2: 27 mEq/L (ref 19–32)
Calcium: 9.1 mg/dL (ref 8.4–10.5)
Chloride: 110 mEq/L (ref 96–112)
Creatinine, Ser: 1.61 mg/dL — ABNORMAL HIGH (ref 0.40–1.20)
GFR: 34.65 mL/min — ABNORMAL LOW (ref >60.00)
Glucose, Bld: 91 mg/dL (ref 70–99)
Potassium: 4.1 mEq/L (ref 3.5–5.1)
Sodium: 141 mEq/L (ref 135–145)

## 2017-01-01 LAB — CBC WITH DIFFERENTIAL/PLATELET
Basophils Absolute: 0.1 10*3/uL (ref 0.0–0.1)
Basophils Relative: 0.7 % (ref 0.0–3.0)
Eosinophils Absolute: 0.2 10*3/uL (ref 0.0–0.7)
Eosinophils Relative: 2.3 % (ref 0.0–5.0)
HCT: 36.1 % (ref 36.0–46.0)
Hemoglobin: 12.2 g/dL (ref 12.0–15.0)
Lymphocytes Relative: 23.3 % (ref 12.0–46.0)
Lymphs Abs: 2 10*3/uL (ref 0.7–4.0)
MCHC: 33.7 g/dL (ref 30.0–36.0)
MCV: 99.4 fl (ref 78.0–100.0)
Monocytes Absolute: 0.4 10*3/uL (ref 0.1–1.0)
Monocytes Relative: 4.4 % (ref 3.0–12.0)
Neutro Abs: 5.8 10*3/uL (ref 1.4–7.7)
Neutrophils Relative %: 69.3 % (ref 43.0–77.0)
Platelets: 253 10*3/uL (ref 150.0–400.0)
RBC: 3.63 Mil/uL — ABNORMAL LOW (ref 3.87–5.11)
RDW: 13.2 % (ref 11.5–15.5)
WBC: 8.4 10*3/uL (ref 4.0–10.5)

## 2017-01-01 LAB — LIPID PANEL
Cholesterol: 214 mg/dL — ABNORMAL HIGH (ref 0–200)
HDL: 64.5 mg/dL (ref >39.00)
LDL Cholesterol: 124 mg/dL — ABNORMAL HIGH (ref 0–99)
NonHDL: 149.81
Total CHOL/HDL Ratio: 3
Triglycerides: 128 mg/dL (ref 0.0–149.0)
VLDL: 25.6 mg/dL (ref 0.0–40.0)

## 2017-01-01 LAB — ALT: ALT: 16 U/L (ref 0–35)

## 2017-01-01 LAB — AST: AST: 21 U/L (ref 0–37)

## 2017-01-01 MED ORDER — DENOSUMAB 60 MG/ML ~~LOC~~ SOLN
60.0000 mg | Freq: Once | SUBCUTANEOUS | Status: AC
  Administered 2017-01-01: 60 mg via SUBCUTANEOUS

## 2017-01-01 MED ORDER — CLORAZEPATE DIPOTASSIUM 7.5 MG PO TABS: 7.5000 mg | ORAL_TABLET | Freq: Three times a day (TID) | ORAL | 0 refills | Status: DC | PRN

## 2017-01-01 NOTE — Patient Instructions (Signed)
GO TO THE LAB : Get the blood work     GO TO THE FRONT DESK Schedule your next appointment for a   Checkup in 6 months    Continue vitamin D at least 1000 units daily  Engage on a exercise program, walk 3 hours a week.
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
## 2017-01-01 NOTE — Progress Notes (Signed)
Pre visit review using our clinic review tool, if applicable. No additional management support is needed unless otherwise documented below in the visit note. 

## 2017-01-01 NOTE — Assessment & Plan Note (Addendum)
Tdap 2012; Zostavax discussed before  Flu shot -- declined pnm shot (smoker) -- declined   Cscope 04-2012--->  Polyps, 5 years, she is due this year, await for GI letter  Female care  Last MMG 04-2015 negative H/o abnormal PAP x 1 in the 90s, f/u by normal ones, PAP 2013 (-) , PAP (-) 02-2015   Labs: BMP, LFTs, FLP, CBC Diet discussed Does not exercise by choice, encouraged to engage on a exercise program.

## 2017-01-01 NOTE — Assessment & Plan Note (Signed)
HTN: Seems well-controlled with prinizide Dyslipidemia: Diet control, checking labs Anxiety: on Tranxene once a day, usually in the morning, denies feeling excessively sleepy, symptoms controlled. RF today Osteoporosis: Prolia  #2 today, RTC 6 months. Increase physical activity, continue vitamin D. RTC 6  Months

## 2017-06-06 ENCOUNTER — Encounter: Payer: Self-pay | Admitting: Internal Medicine

## 2017-06-06 ENCOUNTER — Telehealth: Payer: Self-pay | Admitting: Internal Medicine

## 2017-06-06 NOTE — Telephone Encounter (Signed)
Tricia- can you order Prolia for Pt? Thank you.  

## 2017-06-06 NOTE — Telephone Encounter (Signed)
Prolia benefits verified PA required- form faxed to [facility] Admin and Prolia subject to $4300 OOP max- met  Patient may owe approximately $0 OOP  Due after 06/30/17

## 2017-06-12 NOTE — Telephone Encounter (Signed)
Appt w/ PCP on 07/03/2017, Prolia reminder placed in appt notes.

## 2017-06-12 NOTE — Telephone Encounter (Signed)
Prolia in fridge

## 2017-07-03 ENCOUNTER — Ambulatory Visit (INDEPENDENT_AMBULATORY_CARE_PROVIDER_SITE_OTHER): Payer: BLUE CROSS/BLUE SHIELD | Admitting: Internal Medicine

## 2017-07-03 ENCOUNTER — Encounter: Payer: Self-pay | Admitting: Internal Medicine

## 2017-07-03 VITALS — BP 118/71 | HR 72 | Temp 98.1°F | Resp 14 | Ht 63.0 in | Wt 124.5 lb

## 2017-07-03 DIAGNOSIS — F419 Anxiety disorder, unspecified: Secondary | ICD-10-CM | POA: Diagnosis not present

## 2017-07-03 DIAGNOSIS — I10 Essential (primary) hypertension: Secondary | ICD-10-CM

## 2017-07-03 DIAGNOSIS — M810 Age-related osteoporosis without current pathological fracture: Secondary | ICD-10-CM

## 2017-07-03 LAB — BASIC METABOLIC PANEL
BUN: 25 mg/dL — ABNORMAL HIGH (ref 6–23)
CO2: 25 mEq/L (ref 19–32)
Calcium: 9.5 mg/dL (ref 8.4–10.5)
Chloride: 107 mEq/L (ref 96–112)
Creatinine, Ser: 1.35 mg/dL — ABNORMAL HIGH (ref 0.40–1.20)
GFR: 42.38 mL/min — ABNORMAL LOW (ref >60.00)
Glucose, Bld: 91 mg/dL (ref 70–99)
Potassium: 4.3 mEq/L (ref 3.5–5.1)
Sodium: 139 mEq/L (ref 135–145)

## 2017-07-03 MED ORDER — DENOSUMAB 60 MG/ML ~~LOC~~ SOLN
60.0000 mg | Freq: Once | SUBCUTANEOUS | Status: AC
  Administered 2017-07-03: 60 mg via SUBCUTANEOUS

## 2017-07-03 MED ORDER — CLORAZEPATE DIPOTASSIUM 7.5 MG PO TABS: 7.5000 mg | ORAL_TABLET | Freq: Three times a day (TID) | ORAL | 1 refills | Status: DC | PRN

## 2017-07-03 MED ORDER — LISINOPRIL-HYDROCHLOROTHIAZIDE 10-12.5 MG PO TABS: 1.0000 | ORAL_TABLET | Freq: Every day | ORAL | 1 refills | Status: DC

## 2017-07-03 NOTE — Progress Notes (Signed)
Pre visit review using our clinic review tool, if applicable. No additional management support is needed unless otherwise documented below in the visit note. 

## 2017-07-03 NOTE — Patient Instructions (Signed)
GO TO THE LAB : Get the blood work     GO TO THE FRONT DESK Schedule your next appointment for a physical exam in 6 months, fasting.    
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
## 2017-07-03 NOTE — Assessment & Plan Note (Signed)
HTN: Seems well-controlled with Zestoretic. Check a BMP Anxiety: Controlled on Tranxene as needed, refill today. Osteoporosis: Continue calcium, vitamin D ; prolia today. ? flu shot, "I'll never take one". RTC 6 months, CPX

## 2018-01-03 ENCOUNTER — Encounter: Payer: Self-pay | Admitting: Internal Medicine

## 2018-01-03 ENCOUNTER — Ambulatory Visit (INDEPENDENT_AMBULATORY_CARE_PROVIDER_SITE_OTHER): Payer: BLUE CROSS/BLUE SHIELD | Admitting: Internal Medicine

## 2018-01-03 VITALS — BP 118/68 | HR 73 | Temp 97.8°F | Resp 14 | Ht 63.0 in | Wt 126.4 lb

## 2018-01-03 DIAGNOSIS — Z1231 Encounter for screening mammogram for malignant neoplasm of breast: Secondary | ICD-10-CM

## 2018-01-03 DIAGNOSIS — Z0000 Encounter for general adult medical examination without abnormal findings: Secondary | ICD-10-CM | POA: Diagnosis not present

## 2018-01-03 DIAGNOSIS — Z1211 Encounter for screening for malignant neoplasm of colon: Secondary | ICD-10-CM

## 2018-01-03 LAB — CBC WITH DIFFERENTIAL/PLATELET
Basophils Absolute: 0 10*3/uL (ref 0.0–0.1)
Basophils Relative: 0.6 % (ref 0.0–3.0)
Eosinophils Absolute: 0.2 10*3/uL (ref 0.0–0.7)
Eosinophils Relative: 3.5 % (ref 0.0–5.0)
HCT: 36.5 % (ref 36.0–46.0)
Hemoglobin: 12.4 g/dL (ref 12.0–15.0)
Lymphocytes Relative: 26.2 % (ref 12.0–46.0)
Lymphs Abs: 1.8 10*3/uL (ref 0.7–4.0)
MCHC: 33.9 g/dL (ref 30.0–36.0)
MCV: 100.6 fl — ABNORMAL HIGH (ref 78.0–100.0)
Monocytes Absolute: 0.4 10*3/uL (ref 0.1–1.0)
Monocytes Relative: 6.1 % (ref 3.0–12.0)
Neutro Abs: 4.3 10*3/uL (ref 1.4–7.7)
Neutrophils Relative %: 63.6 % (ref 43.0–77.0)
Platelets: 255 10*3/uL (ref 150.0–400.0)
RBC: 3.63 Mil/uL — ABNORMAL LOW (ref 3.87–5.11)
RDW: 13.3 % (ref 11.5–15.5)
WBC: 6.8 10*3/uL (ref 4.0–10.5)

## 2018-01-03 LAB — LIPID PANEL
Cholesterol: 192 mg/dL (ref 0–200)
HDL: 58.5 mg/dL (ref >39.00)
NonHDL: 133.68
Total CHOL/HDL Ratio: 3
Triglycerides: 208 mg/dL — ABNORMAL HIGH (ref 0.0–149.0)
VLDL: 41.6 mg/dL — ABNORMAL HIGH (ref 0.0–40.0)

## 2018-01-03 LAB — COMPREHENSIVE METABOLIC PANEL
ALT: 19 U/L (ref 0–35)
AST: 21 U/L (ref 0–37)
Albumin: 4.4 g/dL (ref 3.5–5.2)
Alkaline Phosphatase: 54 U/L (ref 39–117)
BUN: 27 mg/dL — ABNORMAL HIGH (ref 6–23)
CO2: 28 mEq/L (ref 19–32)
Calcium: 9.5 mg/dL (ref 8.4–10.5)
Chloride: 105 mEq/L (ref 96–112)
Creatinine, Ser: 1.44 mg/dL — ABNORMAL HIGH (ref 0.40–1.20)
GFR: 39.28 mL/min — ABNORMAL LOW (ref >60.00)
Glucose, Bld: 96 mg/dL (ref 70–99)
Potassium: 4.4 mEq/L (ref 3.5–5.1)
Sodium: 141 mEq/L (ref 135–145)
Total Bilirubin: 0.4 mg/dL (ref 0.2–1.2)
Total Protein: 6.9 g/dL (ref 6.0–8.3)

## 2018-01-03 LAB — LDL CHOLESTEROL, DIRECT: Direct LDL: 104 mg/dL

## 2018-01-03 LAB — TSH: TSH: 1.1 u[IU]/mL (ref 0.35–4.50)

## 2018-01-03 MED ORDER — CLORAZEPATE DIPOTASSIUM 7.5 MG PO TABS: 7.5000 mg | ORAL_TABLET | Freq: Three times a day (TID) | ORAL | 1 refills | Status: DC | PRN

## 2018-01-03 NOTE — Patient Instructions (Signed)
GO TO THE LAB : Get the blood work     GO TO THE FRONT DESK Schedule your next appointment for a physical exam in 1 year    Steps to Quit Smoking Smoking tobacco can be harmful to your health and can affect almost every organ in your body. Smoking puts you, and those around you, at risk for developing many serious chronic diseases. Quitting smoking is difficult, but it is one of the best things that you can do for your health. It is never too late to quit. What are the benefits of quitting smoking? When you quit smoking, you lower your risk of developing serious diseases and conditions, such as:  Lung cancer or lung disease, such as COPD.  Heart disease.  Stroke.  Heart attack.  Infertility.  Osteoporosis and bone fractures.  Additionally, symptoms such as coughing, wheezing, and shortness of breath may get better when you quit. You may also find that you get sick less often because your body is stronger at fighting off colds and infections. If you are pregnant, quitting smoking can help to reduce your chances of having a baby of low birth weight. How do I get ready to quit? When you decide to quit smoking, create a plan to make sure that you are successful. Before you quit:  Pick a date to quit. Set a date within the next two weeks to give you time to prepare.  Write down the reasons why you are quitting. Keep this list in places where you will see it often, such as on your bathroom mirror or in your car or wallet.  Identify the people, places, things, and activities that make you want to smoke (triggers) and avoid them. Make sure to take these actions: ? Throw away all cigarettes at home, at work, and in your car. ? Throw away smoking accessories, such as Scientist, research (medical). ? Clean your car and make sure to empty the ashtray. ? Clean your home, including curtains and carpets.  Tell your family, friends, and coworkers that you are quitting. Support from your loved ones can  make quitting easier.  Talk with your health care provider about your options for quitting smoking.  Find out what treatment options are covered by your health insurance.  What strategies can I use to quit smoking? Talk with your healthcare provider about different strategies to quit smoking. Some strategies include:  Quitting smoking altogether instead of gradually lessening how much you smoke over a period of time. Research shows that quitting "cold [place]" is more successful than gradually quitting.  Attending in-person counseling to help you build problem-solving skills. You are more likely to have success in quitting if you attend several counseling sessions. Even short sessions of 10 minutes can be effective.  Finding resources and support systems that can help you to quit smoking and remain smoke-free after you quit. These resources are most helpful when you use them often. They can include: ? Online chats with a Social worker. ? Telephone quitlines. ? Careers information officer. ? Support groups or group counseling. ? Text messaging programs. ? Mobile phone applications.  Taking medicines to help you quit smoking. (If you are pregnant or breastfeeding, talk with your health care provider first.) Some medicines contain nicotine and some do not. Both types of medicines help with cravings, but the medicines that include nicotine help to relieve withdrawal symptoms. Your health care provider may recommend: ? Nicotine patches, gum, or lozenges. ? Nicotine inhalers or sprays. ? Non-nicotine  medicine that is taken by mouth.  Talk with your health care provider about combining strategies, such as taking medicines while you are also receiving in-person counseling. Using these two strategies together makes you more likely to succeed in quitting than if you used either strategy on its own. If you are pregnant or breastfeeding, talk with your health care provider about finding counseling or other  support strategies to quit smoking. Do not take medicine to help you quit smoking unless told to do so by your health care provider. What things can I do to make it easier to quit? Quitting smoking might feel overwhelming at first, but there is a lot that you can do to make it easier. Take these important actions:  Reach out to your family and friends and ask that they support and encourage you during this time. Call telephone quitlines, reach out to support groups, or work with a counselor for support.  Ask people who smoke to avoid smoking around you.  Avoid places that trigger you to smoke, such as bars, parties, or smoke-break areas at work.  Spend time around people who do not smoke.  Lessen stress in your life, because stress can be a smoking trigger for some people. To lessen stress, try: ? Exercising regularly. ? Deep-breathing exercises. ? Yoga. ? Meditating. ? Performing a body scan. This involves closing your eyes, scanning your body from head to toe, and noticing which parts of your body are particularly tense. Purposefully relax the muscles in those areas.  Download or purchase mobile phone or tablet apps (applications) that can help you stick to your quit plan by providing reminders, tips, and encouragement. There are many free apps, such as QuitGuide from the [employer] Office manager for Disease Control and Prevention). You can find other support for quitting smoking (smoking cessation) through smokefree.gov and other websites.  How will I feel when I quit smoking? Within the first 24 hours of quitting smoking, you may start to feel some withdrawal symptoms. These symptoms are usually most noticeable 2-3 days after quitting, but they usually do not last beyond 2-3 weeks. Changes or symptoms that you might experience include:  Mood swings.  Restlessness, anxiety, or irritation.  Difficulty concentrating.  Dizziness.  Strong cravings for sugary foods in addition to nicotine.  Mild  weight gain.  Constipation.  Nausea.  Coughing or a sore throat.  Changes in how your medicines work in your body.  A depressed mood.  Difficulty sleeping (insomnia).  After the first 2-3 weeks of quitting, you may start to notice more positive results, such as:  Improved sense of smell and taste.  Decreased coughing and sore throat.  Slower heart rate.  Lower blood pressure.  Clearer skin.  The ability to breathe more easily.  Fewer sick days.  Quitting smoking is very challenging for most people. Do not get discouraged if you are not successful the first time. Some people need to make many attempts to quit before they achieve long-term success. Do your best to stick to your quit plan, and talk with your health care provider if you have any questions or concerns. This information is not intended to replace advice given to you by your health care provider. Make sure you discuss any questions you have with your health care provider. Document Released: 10/30/2001 Document Revised: 07/03/2016 Document Reviewed: 03/22/2015 Elsevier Interactive Patient Education  [employer].

## 2018-01-03 NOTE — Assessment & Plan Note (Signed)
Tdap 2012;  Flu shot : declined; pnm shot (smoker) : declined ; shingrix not available  -[facility]: Cscope 04-2012--->  Polyps, 5 years, she is due, refer to GI  -Female care  Last MMG 04-2015 negative, breast exam today, order a mammogram. H/o abnormal PAP x 1 in the 90s, f/u by normal ones, PAP 2013 (-) , PAP (-) 02-2015.  Reluctant to do screenings, will perform a Pap next year -Labs: CMP, FLP, CBC, TSH -tobacco: 20-pack-year history, not ready to quit, risks discussed, see AVS -Does not exercise regularly, encourage to do.
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
## 2018-01-03 NOTE — Progress Notes (Signed)
Pre visit review using our clinic review tool, if applicable. No additional management support is needed unless otherwise documented below in the visit note. 
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
## 2018-01-09 ENCOUNTER — Telehealth: Payer: Self-pay | Admitting: Internal Medicine

## 2018-01-09 ENCOUNTER — Telehealth: Payer: Self-pay

## 2018-01-09 NOTE — Telephone Encounter (Signed)
PA approved until 2022. LMOM informing Pt to call office and schedule nurse visit at her convenience for Prolia (already in fridge and labeled).

## 2018-01-09 NOTE — Telephone Encounter (Signed)
PA initiated via Covermymeds; KEY: [id number]. Received real-time PA approval.   [id number];Review Type:Prior Auth;Coverage Start Date:12/10/2017;Coverage End Date:01/08/2021;

## 2018-01-09 NOTE — Telephone Encounter (Signed)
Prolia benefits received PA is required $25 copay for Prolia and Admin    Patient may owe approximately $25 OOP  Patient due after 12/30/17  Vilma Prader can you work on this PA?

## 2018-01-10 ENCOUNTER — Ambulatory Visit (INDEPENDENT_AMBULATORY_CARE_PROVIDER_SITE_OTHER): Payer: BLUE CROSS/BLUE SHIELD

## 2018-01-10 DIAGNOSIS — M810 Age-related osteoporosis without current pathological fracture: Secondary | ICD-10-CM

## 2018-01-10 MED ORDER — DENOSUMAB 60 MG/ML ~~LOC~~ SOLN
60.0000 mg | Freq: Once | SUBCUTANEOUS | Status: AC
  Administered 2018-01-10: 60 mg via SUBCUTANEOUS

## 2018-01-10 NOTE — Progress Notes (Signed)
Patient was seen today for her Prolia injection.  She tolerated it well with no complaints and no concerns.

## 2018-01-10 NOTE — Addendum Note (Signed)
Addended by: Magdalene Molly A on: 01/10/2018 04:08 PM   Modules accepted: Level of Service

## 2018-01-13 ENCOUNTER — Other Ambulatory Visit: Payer: Self-pay | Admitting: Internal Medicine

## 2018-01-13 NOTE — Telephone Encounter (Signed)
Prolia completed 01/10/2018.

## 2018-03-14 ENCOUNTER — Encounter: Payer: Self-pay | Admitting: Internal Medicine

## 2018-04-04 ENCOUNTER — Encounter: Payer: BLUE CROSS/BLUE SHIELD | Admitting: Internal Medicine

## 2018-05-07 ENCOUNTER — Ambulatory Visit (AMBULATORY_SURGERY_CENTER): Payer: Self-pay | Admitting: *Deleted

## 2018-05-07 ENCOUNTER — Other Ambulatory Visit: Payer: Self-pay

## 2018-05-07 VITALS — Ht 63.0 in | Wt 124.4 lb

## 2018-05-07 DIAGNOSIS — Z86010 Personal history of colonic polyps: Secondary | ICD-10-CM

## 2018-05-07 NOTE — Progress Notes (Signed)
No egg or soy allergy known to patient  No issues with past sedation with any surgeries  or procedures, no intubation problems  No diet pills per patient No home 02 use per patient  No blood thinners per patient  Pt denies issues with constipation  No A fib or A flutter  EMMI video offered , patient declined

## 2018-05-26 ENCOUNTER — Ambulatory Visit (AMBULATORY_SURGERY_CENTER): Payer: BLUE CROSS/BLUE SHIELD | Admitting: Internal Medicine

## 2018-05-26 ENCOUNTER — Other Ambulatory Visit: Payer: Self-pay

## 2018-05-26 ENCOUNTER — Encounter: Payer: Self-pay | Admitting: Internal Medicine

## 2018-05-26 VITALS — BP 122/86 | HR 55 | Temp 98.6°F | Resp 14 | Ht 63.0 in | Wt 124.0 lb

## 2018-05-26 DIAGNOSIS — Z86010 Personal history of colonic polyps: Secondary | ICD-10-CM

## 2018-05-26 DIAGNOSIS — Z1211 Encounter for screening for malignant neoplasm of colon: Secondary | ICD-10-CM | POA: Diagnosis not present

## 2018-05-26 MED ORDER — SODIUM CHLORIDE 0.9 % IV SOLN: 500.0000 mL | Freq: Once | INTRAVENOUS | Status: DC

## 2018-05-26 NOTE — Op Note (Signed)
[place] Patient Name: Kimberly Valenzuela Procedure Date: 05/26/2018 8:41 AM MRN: [id number] Endoscopist: Gatha Mayer , MD Age: 62 Referring MD:  Date of Birth: 09-27-1956 Gender: Female Account #: [id number] Procedure:                Colonoscopy Indications:              Surveillance: Personal history of adenomatous                            polyps on last colonoscopy > 5 years ago Medicines:                Propofol per Anesthesia, Monitored Anesthesia Care Procedure:                Pre-Anesthesia Assessment:                           - Prior to the procedure, a History and Physical                            was performed, and patient medications and                            allergies were reviewed. The patient's tolerance of                            previous anesthesia was also reviewed. The risks                            and benefits of the procedure and the sedation                            options and risks were discussed with the patient.                            All questions were answered, and informed consent                            was obtained. Prior Anticoagulants: The patient has                            taken no previous anticoagulant or antiplatelet                            agents. ASA Grade Assessment: II - A patient with                            mild systemic disease. After reviewing the risks                            and benefits, the patient was deemed in                            satisfactory condition to undergo the procedure.  After obtaining informed consent, the colonoscope                            was passed under direct vision. Throughout the                            procedure, the patient's blood pressure, pulse, and                            oxygen saturations were monitored continuously. The                            Colonoscope was introduced through the anus and   advanced to the the cecum, identified by                            appendiceal orifice and ileocecal valve. The                            colonoscopy was performed without difficulty. The                            patient tolerated the procedure well. The quality                            of the bowel preparation was excellent. The                            ileocecal valve, appendiceal orifice, and rectum                            were photographed. The bowel preparation used was                            Miralax. Scope In: 8:56:11 AM Scope Out: 9:12:04 AM Scope Withdrawal Time: 0 hours 13 minutes 1 second  Total Procedure Duration: 0 hours 15 minutes 53 seconds  Findings:                 The perianal and digital rectal examinations were                            normal.                           A few diverticula were found in the right colon.                           The exam was otherwise without abnormality on                            direct and retroflexion views. Complications:            No immediate complications. Estimated Blood Loss:     Estimated blood loss: none. Impression:               -  Diverticulosis in the right colon.                           - The examination was otherwise normal on direct                            and retroflexion views.                           - No specimens collected. Recommendation:           - Patient has a contact number available for                            emergencies. The signs and symptoms of potential                            delayed complications were discussed with the                            patient. Return to normal activities tomorrow.                            Written discharge instructions were provided to the                            patient.                           - Resume previous diet.                           - Continue present medications.                           - Repeat colonoscopy in 10  years. hx diminutive                            ssp/a 2013 Gatha Mayer, MD 05/26/2018 9:18:59 AM This report has been signed electronically.

## 2018-05-26 NOTE — Progress Notes (Signed)
Pt ate corn Friday and she ate a bacon cheese burger yesterday am at 10:00.  I reported to Dr. Carlean Purl.  maw

## 2018-05-26 NOTE — Progress Notes (Signed)
Pt's states no medical or surgical changes since previsit or office visit. 

## 2018-05-26 NOTE — Patient Instructions (Addendum)
   No polyps today so next routine colonoscopy or other screening test in 10 years - 2029.  I appreciate the opportunity to care for you. Gatha Mayer, MD, [facility]  Diverticulosis handout given to patient.  Resume previous diet. Continue present medications.  YOU HAD AN ENDOSCOPIC PROCEDURE TODAY AT [facility]:   Refer to the procedure report that was given to you for any specific questions about what was found during the examination.  If the procedure report does not answer your questions, please call your gastroenterologist to clarify.  If you requested that your care partner not be given the details of your procedure findings, then the procedure report has been included in a sealed envelope for you to review at your convenience later.  YOU SHOULD EXPECT: Some feelings of bloating in the abdomen. Passage of more gas than usual.  Walking can help get rid of the air that was put into your GI tract during the procedure and reduce the bloating. If you had a lower endoscopy (such as a colonoscopy or flexible sigmoidoscopy) you may notice spotting of blood in your stool or on the toilet paper. If you underwent a bowel prep for your procedure, you may not have a normal bowel movement for a few days.  Please Note:  You might notice some irritation and congestion in your nose or some drainage.  This is from the oxygen used during your procedure.  There is no need for concern and it should clear up in a day or so.  SYMPTOMS TO REPORT IMMEDIATELY:   Following lower endoscopy (colonoscopy or flexible sigmoidoscopy):  Excessive amounts of blood in the stool  Significant tenderness or worsening of abdominal pains  Swelling of the abdomen that is new, acute  Fever of 100F or higher  For urgent or emergent issues, a gastroenterologist can be reached at any hour by calling [phone].   DIET:  We do recommend a small meal at first, but then you may proceed to your regular  diet.  Drink plenty of fluids but you should avoid alcoholic beverages for 24 hours.  ACTIVITY:  You should plan to take it easy for the rest of today and you should NOT DRIVE or use heavy machinery until tomorrow (because of the sedation medicines used during the test).    FOLLOW UP: Our staff will call the number listed on your records the next business day following your procedure to check on you and address any questions or concerns that you may have regarding the information given to you following your procedure. If we do not reach you, we will leave a message.  However, if you are feeling well and you are not experiencing any problems, there is no need to return our call.  We will assume that you have returned to your regular daily activities without incident.  If any biopsies were taken you will be contacted by phone or by letter within the next 1-3 weeks.  Please call us at [phone] if you have not heard about the biopsies in 3 weeks.    SIGNATURES/CONFIDENTIALITY: You and/or your care partner have signed paperwork which will be entered into your electronic medical record.  These signatures attest to the fact that that the information above on your After Visit Summary has been reviewed and is understood.  Full responsibility of the confidentiality of this discharge information lies with you and/or your care-partner.

## 2018-05-27 ENCOUNTER — Telehealth: Payer: Self-pay

## 2018-05-27 ENCOUNTER — Telehealth: Payer: Self-pay | Admitting: *Deleted

## 2018-05-27 NOTE — Telephone Encounter (Signed)
Left message, will call back later today, B.Hisako Bugh RN 

## 2018-05-27 NOTE — Telephone Encounter (Signed)
Left message on 2nd f/u call 

## 2018-06-18 ENCOUNTER — Telehealth: Payer: Self-pay | Admitting: Internal Medicine

## 2018-06-18 NOTE — Telephone Encounter (Signed)
Prolia benefits received PA is required- on file 12/10/17-01/08/21 $25 Copay    Patient may owe approximately $25 OOP  Patient due after 07/09/18  Letter mailed to inform patient of benefits and to schedule

## 2018-07-02 NOTE — Telephone Encounter (Signed)
Pt. Phoned [facility] to set up an appointment for her prolia injection. Routed to [place], CMA who orders prolia, to schedule best time for pt. to come.

## 2018-07-02 NOTE — Telephone Encounter (Signed)
Noted  

## 2018-07-02 NOTE — Telephone Encounter (Signed)
Spoke with pt. She is scheduled for f/u with Dr Larose Kells on 07/14/18 and will get Prolia at that time. Medication is in fridge for pt.

## 2018-07-14 ENCOUNTER — Encounter: Payer: Self-pay | Admitting: Internal Medicine

## 2018-07-14 ENCOUNTER — Ambulatory Visit (INDEPENDENT_AMBULATORY_CARE_PROVIDER_SITE_OTHER): Payer: BLUE CROSS/BLUE SHIELD | Admitting: Internal Medicine

## 2018-07-14 VITALS — BP 118/74 | HR 65 | Temp 98.2°F | Resp 16 | Ht 63.0 in | Wt 124.0 lb

## 2018-07-14 DIAGNOSIS — F419 Anxiety disorder, unspecified: Secondary | ICD-10-CM

## 2018-07-14 DIAGNOSIS — E785 Hyperlipidemia, unspecified: Secondary | ICD-10-CM

## 2018-07-14 DIAGNOSIS — M810 Age-related osteoporosis without current pathological fracture: Secondary | ICD-10-CM

## 2018-07-14 DIAGNOSIS — I10 Essential (primary) hypertension: Secondary | ICD-10-CM

## 2018-07-14 MED ORDER — DENOSUMAB 60 MG/ML ~~LOC~~ SOSY
60.0000 mg | PREFILLED_SYRINGE | Freq: Once | SUBCUTANEOUS | Status: AC
  Administered 2018-07-14: 60 mg via SUBCUTANEOUS

## 2018-07-14 MED ORDER — LISINOPRIL-HYDROCHLOROTHIAZIDE 10-12.5 MG PO TABS: 1.0000 | ORAL_TABLET | Freq: Every day | ORAL | 2 refills | Status: DC

## 2018-07-14 MED ORDER — CLORAZEPATE DIPOTASSIUM 7.5 MG PO TABS: 7.5000 mg | ORAL_TABLET | Freq: Three times a day (TID) | ORAL | 0 refills | Status: DC | PRN

## 2018-07-14 NOTE — Progress Notes (Signed)
Subjective:    Patient ID: Kimberly Valenzuela, female    DOB: Sep 10, 1956, 62 y.o.   MRN: [id number]  DOS:  07/14/2018 Type of visit - description : Follow-up Interval history: Here for a Prolia shot HTN: Needs medications refilled anxiety: Since the last visit, she lost her husband, she is doing "okay".  Actually his stress has decreased now that he is passed.  Still needs meds prn   Review of Systems To retire next month, feeling well about that  Past Medical History:  Diagnosis Date  . Anxiety 08/03/2011  . Back pain 2015   MRI, local injection, better after injection  . Dyslipidemia    high TG , dx ~ 01-2011  . Hypertension   . Menopause    after BCP were d/c ~ 2010  . Osteoporosis     Past Surgical History:  Procedure Laterality Date  . CESAREAN SECTION    . COLONOSCOPY    . POLYPECTOMY      Social History   Socioeconomic History  . Marital status: Widowed    Spouse name: Not on file  . Number of children: 1  . Years of education: Not on file  . Highest education level: Not on file  Occupational History  . Occupation: Press photographer  . Financial resource strain: Not on file  . Food insecurity:    Worry: Not on file    Inability: Not on file  . Transportation needs:    Medical: Not on file    Non-medical: Not on file  Tobacco Use  . Smoking status: Current Every Day Smoker    Packs/day: 0.50    Years: 40.00    Pack years: 20.00    Types: Cigarettes  . Smokeless tobacco: Never Used  . Tobacco comment: ~ 1/2 ppd  Substance and Sexual Activity  . Alcohol use: Yes    Comment: 7 glasses of wine coolers  . Drug use: Yes    Frequency: 4.0 times per week    Types: Marijuana    Comment: last smoked yesterday marijuana  . Sexual activity: Not on file  Lifestyle  . Physical activity:    Days per week: Not on file    Minutes per session: Not on file  . Stress: Not on file  Relationships  . Social connections:    Talks on phone: Not on file    Gets  together: Not on file    Attends religious service: Not on file    Active member of club or organization: Not on file    Attends meetings of clubs or organizations: Not on file    Relationship status: Not on file  . Intimate partner violence:    Fear of current or ex partner: Not on file    Emotionally abused: Not on file    Physically abused: Not on file    Forced sexual activity: Not on file  Other Topics Concern  . Not on file  Social History Narrative   Lost parents 2014   Lost her husband ~ 01-2018   Lives by herself   To retire 06-2018      Allergies as of 07/14/2018   No Known Allergies     Medication List        Accurate as of 07/14/18 11:59 PM. Always use your most recent med list.          clorazepate 7.5 MG tablet Commonly known as:  TRANXENE Take 1 tablet (7.5 mg total)  by mouth 3 (three) times daily as needed.   denosumab 60 MG/ML Soln injection Commonly known as:  PROLIA Inject 60 mg into the skin every 6 (six) months. Administer in upper arm, thigh, or abdomen   lisinopril-hydrochlorothiazide 10-12.5 MG tablet Commonly known as:  PRINZIDE,ZESTORETIC Take 1 tablet by mouth daily.   ONE-A-DAY WOMENS FORMULA Tabs Take 1 each by mouth daily. Womens Menopause Formula   Vitamin D 1000 units capsule Take 1,000 Units by mouth daily.          Objective:   Physical Exam BP 118/74 (BP Location: Right Arm, Patient Position: Sitting, Cuff Size: Small)   Pulse 65   Temp 98.2 F (36.8 C) (Oral)   Resp 16   Ht 5\' 3"  (1.6 m)   Wt 124 lb (56.2 kg)   SpO2 98%   BMI 21.97 kg/m  General:   Well developed, NAD, see BMI.  HEENT:  Normocephalic . Face symmetric, atraumatic Lungs:  CTA B Normal respiratory effort, no intercostal retractions, no accessory muscle use. Heart: RRR,  no murmur.  No pretibial edema bilaterally  Skin: Not pale. Not jaundice Neurologic:  alert & oriented X3.  Speech normal, gait appropriate for age and unassisted Psych--    Cognition and judgment appear intact.  Cooperative with normal attention span and concentration.  Behavior appropriate. No anxious or depressed appearing.      Assessment & Plan:    Assessment HTN Increase creatinine: Baseline 1.2 >>> 1.59 (06-2015), wnl renal US (09-2015) Dyslipidemia Anxiety - tranxene  Back pain, 2015, had a MRI, improve after a local injection H/o iron deficiency anemia , declined GI 2016  Osteoporosis  --T score -2.8 (2013), unable to rx reclast --> started Boniva 2014 --T score -3.4 (03-2016)  worse d/t poor compliance versus no response to [place],  PROLIA #3 07-03-17 Menopause after BCP d/c  2010 Lipoma R upper back +FH ovarian cancer, GM and mother. Had genetic testing  2014 ---> negative   PLAN: HTN: On Zestoretic, last BMP satisfactory, refill sent. Dyslipidemia: Diet controlled.  Last FLP satisfactory Anxiety: Refilled Tranxene. Osteoporosis, Prolia shot today, recommend to stay active, take calcium and vitamin D RTC 12-2018, CPX.

## 2018-07-14 NOTE — Patient Instructions (Signed)
   GO TO THE FRONT DESK Schedule your next appointment for a physical exam by 12-2018

## 2018-07-15 NOTE — Assessment & Plan Note (Signed)
HTN: On Zestoretic, last BMP satisfactory, refill sent. Dyslipidemia: Diet controlled.  Last FLP satisfactory Anxiety: Refilled Tranxene. Osteoporosis, Prolia shot today, recommend to stay active, take calcium and vitamin D RTC 12-2018, CPX.

## 2018-11-19 HISTORY — PX: FACELIFT: SHX1566

## 2018-11-26 ENCOUNTER — Telehealth: Payer: Self-pay

## 2018-11-26 NOTE — Telephone Encounter (Signed)
Prolia--awaiting insurance benefits.

## 2018-11-27 NOTE — Telephone Encounter (Signed)
Letter mailed to Pt informing when she is due for her next Prolia- however informed that we are unable to verify her benefits through insurance w/o new insurance cards. Instructed to come by office with letter and copy of new cards.

## 2018-11-27 NOTE — Telephone Encounter (Signed)
Current policy terminated-- will need updated insurance cards brought to office.

## 2018-12-18 NOTE — Telephone Encounter (Signed)
Pt came by office- new insurance in chart. Benefits will need to be re-verified.

## 2018-12-19 NOTE — Telephone Encounter (Signed)
Pt has been re verified. Waiting on summary of benefits.

## 2019-01-05 ENCOUNTER — Encounter: Payer: Self-pay | Admitting: Internal Medicine

## 2019-01-05 ENCOUNTER — Ambulatory Visit (INDEPENDENT_AMBULATORY_CARE_PROVIDER_SITE_OTHER): Payer: BLUE CROSS/BLUE SHIELD | Admitting: Internal Medicine

## 2019-01-05 VITALS — BP 126/68 | HR 87 | Temp 99.0°F | Resp 16 | Ht 63.0 in | Wt 122.4 lb

## 2019-01-05 DIAGNOSIS — Z1231 Encounter for screening mammogram for malignant neoplasm of breast: Secondary | ICD-10-CM

## 2019-01-05 DIAGNOSIS — Z0000 Encounter for general adult medical examination without abnormal findings: Secondary | ICD-10-CM

## 2019-01-05 DIAGNOSIS — Z23 Encounter for immunization: Secondary | ICD-10-CM

## 2019-01-05 DIAGNOSIS — Z79899 Other long term (current) drug therapy: Secondary | ICD-10-CM

## 2019-01-05 DIAGNOSIS — Z01419 Encounter for gynecological examination (general) (routine) without abnormal findings: Secondary | ICD-10-CM

## 2019-01-05 DIAGNOSIS — F419 Anxiety disorder, unspecified: Secondary | ICD-10-CM | POA: Diagnosis not present

## 2019-01-05 DIAGNOSIS — Z122 Encounter for screening for malignant neoplasm of respiratory organs: Secondary | ICD-10-CM

## 2019-01-05 DIAGNOSIS — Z1272 Encounter for screening for malignant neoplasm of vagina: Secondary | ICD-10-CM

## 2019-01-05 LAB — CBC WITH DIFFERENTIAL/PLATELET
Basophils Absolute: 0 10*3/uL (ref 0.0–0.1)
Basophils Relative: 0.5 % (ref 0.0–3.0)
Eosinophils Absolute: 0.2 10*3/uL (ref 0.0–0.7)
Eosinophils Relative: 2.6 % (ref 0.0–5.0)
HCT: 34 % — ABNORMAL LOW (ref 36.0–46.0)
Hemoglobin: 11.6 g/dL — ABNORMAL LOW (ref 12.0–15.0)
Lymphocytes Relative: 27.9 % (ref 12.0–46.0)
Lymphs Abs: 2.2 10*3/uL (ref 0.7–4.0)
MCHC: 34 g/dL (ref 30.0–36.0)
MCV: 99.4 fl (ref 78.0–100.0)
Monocytes Absolute: 0.5 10*3/uL (ref 0.1–1.0)
Monocytes Relative: 6.2 % (ref 3.0–12.0)
Neutro Abs: 4.9 10*3/uL (ref 1.4–7.7)
Neutrophils Relative %: 62.8 % (ref 43.0–77.0)
Platelets: 218 10*3/uL (ref 150.0–400.0)
RBC: 3.42 Mil/uL — ABNORMAL LOW (ref 3.87–5.11)
RDW: 14 % (ref 11.5–15.5)
WBC: 7.7 10*3/uL (ref 4.0–10.5)

## 2019-01-05 LAB — COMPREHENSIVE METABOLIC PANEL
ALT: 17 U/L (ref 0–35)
AST: 20 U/L (ref 0–37)
Albumin: 4.5 g/dL (ref 3.5–5.2)
Alkaline Phosphatase: 62 U/L (ref 39–117)
BUN: 25 mg/dL — ABNORMAL HIGH (ref 6–23)
CO2: 28 mEq/L (ref 19–32)
Calcium: 10.6 mg/dL — ABNORMAL HIGH (ref 8.4–10.5)
Chloride: 103 mEq/L (ref 96–112)
Creatinine, Ser: 1.44 mg/dL — ABNORMAL HIGH (ref 0.40–1.20)
GFR: 36.83 mL/min — ABNORMAL LOW (ref >60.00)
Glucose, Bld: 92 mg/dL (ref 70–99)
Potassium: 5.3 mEq/L — ABNORMAL HIGH (ref 3.5–5.1)
Sodium: 141 mEq/L (ref 135–145)
Total Bilirubin: 0.4 mg/dL (ref 0.2–1.2)
Total Protein: 6.4 g/dL (ref 6.0–8.3)

## 2019-01-05 LAB — LIPID PANEL
Cholesterol: 204 mg/dL — ABNORMAL HIGH (ref 0–200)
HDL: 49.9 mg/dL (ref >39.00)
NonHDL: 153.71
Total CHOL/HDL Ratio: 4
Triglycerides: 311 mg/dL — ABNORMAL HIGH (ref 0.0–149.0)
VLDL: 62.2 mg/dL — ABNORMAL HIGH (ref 0.0–40.0)

## 2019-01-05 LAB — LDL CHOLESTEROL, DIRECT: Direct LDL: 118 mg/dL

## 2019-01-05 NOTE — Assessment & Plan Note (Addendum)
--  Tdap 2012;  Flu shot, pnm shot (smoker) : declined ;  shingrix : First shot provided today, return in 2 months -CCS: Cscope 04-2012--->  Polyps, Cscope 05/26/2018, next per GI -Female care  Last MMG 04-2015 negative, 12/2017: wnl clinical breast exam but  MMG referral failed.  Plan: Refer for mammogram H/o abnormal PAP x 1 in the 90s, f/u by normal ones, PAP 2013 (-) , PAP (-) 02-2015.  Declined a Pap today, will refer to gynecology (might not need further PAPs) -Lung Ca screening:Rx lung CT  -Diet and exercise discussed -Tobacco abuse: Counseled, not ready to quit  Wellbutrin, Chantix? -Labs: CMP, FLP, CBC

## 2019-01-05 NOTE — Progress Notes (Signed)
Subjective:    Patient ID: Kimberly Valenzuela, female    DOB: 05/14/1956, 63 y.o.   MRN: [id number]  DOS:  01/05/2019 Type of visit - description: CPX Since the last office visit, she is feeling well.  She retired.  Review of Systems  A 14 point review of systems is negative   Past Medical History:  Diagnosis Date  . Anxiety 08/03/2011  . Back pain 2015   MRI, local injection, better after injection  . Dyslipidemia    high TG , dx ~ 01-2011  . Hypertension   . Menopause    after BCP were d/c ~ 2010  . Osteoporosis     Past Surgical History:  Procedure Laterality Date  . CESAREAN SECTION    . COLONOSCOPY    . POLYPECTOMY      Social History   Socioeconomic History  . Marital status: Widowed    Spouse name: Not on file  . Number of children: 1  . Years of education: Not on file  . Highest education level: Not on file  Occupational History  . Occupation: Press photographer  . Financial resource strain: Not on file  . Food insecurity:    Worry: Not on file    Inability: Not on file  . Transportation needs:    Medical: Not on file    Non-medical: Not on file  Tobacco Use  . Smoking status: Current Every Day Smoker    Packs/day: 0.50    Years: 40.00    Pack years: 20.00    Types: Cigarettes  . Smokeless tobacco: Never Used  . Tobacco comment: ~ 1/2 ppd  Substance and Sexual Activity  . Alcohol use: Yes    Comment: 7 glasses of wine coolers  . Drug use: Yes    Frequency: 4.0 times per week    Types: Marijuana    Comment: last smoked yesterday marijuana  . Sexual activity: Not on file  Lifestyle  . Physical activity:    Days per week: Not on file    Minutes per session: Not on file  . Stress: Not on file  Relationships  . Social connections:    Talks on phone: Not on file    Gets together: Not on file    Attends religious service: Not on file    Active member of club or organization: Not on file    Attends meetings of clubs or organizations: Not on  file    Relationship status: Not on file  . Intimate partner violence:    Fear of current or ex partner: Not on file    Emotionally abused: Not on file    Physically abused: Not on file    Forced sexual activity: Not on file  Other Topics Concern  . Not on file  Social History Narrative   Lost parents 2014   Lost her husband ~ 01-2018   Lives by herself   To retire 06-2018     Family History  Problem Relation Age of Onset  . Ovarian cancer Mother 32  . Colon cancer Maternal Grandmother 34       dx in her 11s  . Renal cancer Maternal Grandfather        dx in his 54s  . Lung cancer Maternal Uncle        smoker  . Diabetes Neg Hx   . Coronary artery disease Neg Hx   . Stroke Neg Hx   . Breast cancer Neg Hx   .  Esophageal cancer Neg Hx   . Liver cancer Neg Hx   . Pancreatic cancer Neg Hx   . Rectal cancer Neg Hx   . Stomach cancer Neg Hx      Allergies as of 01/05/2019   No Known Allergies     Medication List       Accurate as of January 05, 2019  9:05 AM. Always use your most recent med list.        clorazepate 7.5 MG tablet Commonly known as:  TRANXENE Take 1 tablet (7.5 mg total) by mouth 3 (three) times daily as needed.   denosumab 60 MG/ML Soln injection Commonly known as:  PROLIA Inject 60 mg into the skin every 6 (six) months. Administer in upper arm, thigh, or abdomen   lisinopril-hydrochlorothiazide 10-12.5 MG tablet Commonly known as:  PRINZIDE,ZESTORETIC Take 1 tablet by mouth daily.   ONE-A-DAY WOMENS FORMULA Tabs Take 1 each by mouth daily. Womens Menopause Formula   Vitamin D 1000 units capsule Take 1,000 Units by mouth daily.           Objective:   Physical Exam BP 126/68 (BP Location: Left Arm, Patient Position: Sitting, Cuff Size: Small)   Pulse 87   Temp 99 F (37.2 C) (Oral)   Resp 16   Ht 5\' 3"  (1.6 m)   Wt 122 lb 6 oz (55.5 kg)   SpO2 98%   BMI 21.68 kg/m  General: Well developed, NAD, BMI noted Neck: No  thyromegaly    HEENT:  Normocephalic . Face symmetric, atraumatic Lungs:  CTA B Normal respiratory effort, no intercostal retractions, no accessory muscle use. Heart: RRR,  no murmur.  No pretibial edema bilaterally  Abdomen:  Not distended, soft, non-tender. No rebound or rigidity.   Skin: Exposed areas without rash. Not pale. Not jaundice Neurologic:  alert & oriented X3.  Speech normal, gait appropriate for age and unassisted Strength symmetric and appropriate for age.  Psych: Cognition and judgment appear intact.  Cooperative with normal attention span and concentration.  Behavior appropriate. No anxious or depressed appearing.     Assessment     Assessment HTN Increase creatinine: Baseline 1.2 >>> 1.59 (06-2015), wnl renal US (09-2015) Dyslipidemia Anxiety - tranxene  Back pain, 2015, had a MRI, improve after a local injection H/o iron deficiency anemia  Osteoporosis  --T score -2.8 (2013), unable to rx reclast --> started Boniva 2014 --T score -3.4 (03-2016)  worse d/t poor compliance versus no response to [place],  PROLIA #3 07-03-17 Menopause after BCP d/c  2010 Lipoma R upper back +FH ovarian cancer, GM and mother. Had genetic testing  2014 ---> negative   PLAN: HTN: Continue Zestoretic, BP is very good.  Checking labs Dyslipidemia: Diet controlled, checking labs Anxiety: On Tranxene, UDS and contract today; smokes marihuana sometimes  Osteoporosis: Due for Prolia. Working on the [employer]. RTC 1 year    HTN: On Zestoretic, last BMP satisfactory, refill sent. Dyslipidemia: Diet controlled.  Last FLP satisfactory Anxiety: Refilled Tranxene. Osteoporosis, Prolia shot today, recommend to stay active, take calcium and vitamin D RTC 12-2018, CPX.

## 2019-01-05 NOTE — Progress Notes (Signed)
Pre visit review using our clinic review tool, if applicable. No additional management support is needed unless otherwise documented below in the visit note. 

## 2019-01-05 NOTE — Patient Instructions (Signed)
GO TO THE LAB : Get the blood work     GO TO THE FRONT DESK Schedule your next appointment   For [facility] in 2 months   Schedule a physical in 1 year   Take Vitamin D 1000 units a day

## 2019-01-06 ENCOUNTER — Ambulatory Visit (HOSPITAL_BASED_OUTPATIENT_CLINIC_OR_DEPARTMENT_OTHER)
Admission: RE | Admit: 2019-01-06 | Discharge: 2019-01-06 | Disposition: A | Discharge status: Home / Self Care | Payer: BLUE CROSS/BLUE SHIELD | Source: Ambulatory Visit | Attending: Internal Medicine | Admitting: Internal Medicine

## 2019-01-06 ENCOUNTER — Other Ambulatory Visit (INDEPENDENT_AMBULATORY_CARE_PROVIDER_SITE_OTHER): Payer: BLUE CROSS/BLUE SHIELD

## 2019-01-06 DIAGNOSIS — Z122 Encounter for screening for malignant neoplasm of respiratory organs: Secondary | ICD-10-CM | POA: Diagnosis not present

## 2019-01-06 DIAGNOSIS — Z1231 Encounter for screening mammogram for malignant neoplasm of breast: Secondary | ICD-10-CM | POA: Insufficient documentation

## 2019-01-06 DIAGNOSIS — D649 Anemia, unspecified: Secondary | ICD-10-CM

## 2019-01-06 DIAGNOSIS — F17210 Nicotine dependence, cigarettes, uncomplicated: Secondary | ICD-10-CM | POA: Diagnosis not present

## 2019-01-06 LAB — IRON: Iron: 90 ug/dL (ref 42–145)

## 2019-01-06 NOTE — Assessment & Plan Note (Signed)
PLAN: HTN: Continue Zestoretic, BP is very good.  Checking labs Dyslipidemia: Diet controlled, checking labs Anxiety: On Tranxene, UDS and contract today; smokes marihuana sometimes  Osteoporosis: Due for Prolia. Working on the [employer]. RTC 1 year

## 2019-01-07 ENCOUNTER — Telehealth: Payer: Self-pay | Admitting: Internal Medicine

## 2019-01-07 DIAGNOSIS — E785 Hyperlipidemia, unspecified: Secondary | ICD-10-CM

## 2019-01-07 DIAGNOSIS — I10 Essential (primary) hypertension: Secondary | ICD-10-CM

## 2019-01-07 LAB — FERRITIN: Ferritin: 19.8 ng/mL (ref 10.0–291.0)

## 2019-01-07 MED ORDER — ATORVASTATIN CALCIUM 20 MG PO TABS: 20.0000 mg | ORAL_TABLET | Freq: Every day | ORAL | 2 refills | Status: DC

## 2019-01-07 NOTE — Telephone Encounter (Signed)
Lipitor sent to pharmacy. Labs ordered. Low potassium diet information mailed to Pt.

## 2019-01-07 NOTE — Telephone Encounter (Signed)
CT showed no lung cancer but coronary atherosclerosis.  Needs stricter CV RF control Labs show LDL of 118, non-iron deficiency anemia, mild.  Potassium 5.3. Plan: Low potassium diet, please send information. Start Lipitor 20 mg daily, send a prescription, 1 p.o. nightly #30, 2 refills Labs in 6 weeks fasting: FLP, AST, ALT, BMP.  Please enter orders and make a lab appointment All of the above was discussed with the patient and she is in agreement

## 2019-01-08 LAB — PAIN MGMT, PROFILE 8 W/CONF, U
6 Acetylmorphine: NEGATIVE ng/mL (ref <10)
Alcohol Metabolites: POSITIVE ng/mL — AB (ref <500)
Alphahydroxyalprazolam: NEGATIVE ng/mL (ref <25)
Alphahydroxymidazolam: NEGATIVE ng/mL (ref <50)
Alphahydroxytriazolam: NEGATIVE ng/mL (ref <50)
Aminoclonazepam: NEGATIVE ng/mL (ref <25)
Amphetamines: NEGATIVE ng/mL (ref <500)
Benzodiazepines: POSITIVE ng/mL — AB (ref <100)
Buprenorphine, Urine: NEGATIVE ng/mL (ref <5)
Cocaine Metabolite: NEGATIVE ng/mL (ref <150)
Creatinine: 100.9 mg/dL
Ethyl Glucuronide (ETG): 77261 ng/mL — ABNORMAL HIGH (ref <500)
Ethyl Sulfate (ETS): 16936 ng/mL — ABNORMAL HIGH (ref <100)
Hydroxyethylflurazepam: NEGATIVE ng/mL (ref <50)
Lorazepam: NEGATIVE ng/mL (ref <50)
MDMA: NEGATIVE ng/mL (ref <500)
Marijuana Metabolite: 158 ng/mL — ABNORMAL HIGH (ref <5)
Marijuana Metabolite: POSITIVE ng/mL — AB (ref <20)
Nordiazepam: 297 ng/mL — ABNORMAL HIGH (ref <50)
Opiates: NEGATIVE ng/mL (ref <100)
Oxazepam: 2174 ng/mL — ABNORMAL HIGH (ref <50)
Oxidant: NEGATIVE ug/mL (ref <200)
Oxycodone: NEGATIVE ng/mL (ref <100)
Temazepam: NEGATIVE ng/mL (ref <50)
pH: 5.81 (ref 4.5–9.0)

## 2019-01-23 ENCOUNTER — Other Ambulatory Visit: Payer: Self-pay

## 2019-01-27 ENCOUNTER — Telehealth: Payer: Self-pay

## 2019-01-27 MED ORDER — DENOSUMAB 60 MG/ML ~~LOC~~ SOSY: 60.0000 mg | PREFILLED_SYRINGE | Freq: Once | SUBCUTANEOUS | 0 refills | Status: AC

## 2019-01-27 NOTE — Telephone Encounter (Signed)
PA denied by plan. Not reviewable?

## 2019-01-27 NOTE — Telephone Encounter (Signed)
PA initiated via Covermymeds; KEY: [id number]. Awaiting determination.

## 2019-01-28 NOTE — Telephone Encounter (Signed)
Received fax from insurance that injection was approved through plan. Reference number [id number].

## 2019-01-29 ENCOUNTER — Other Ambulatory Visit: Payer: Self-pay

## 2019-01-29 ENCOUNTER — Encounter: Payer: Self-pay | Admitting: Family Medicine

## 2019-01-29 ENCOUNTER — Ambulatory Visit (INDEPENDENT_AMBULATORY_CARE_PROVIDER_SITE_OTHER): Payer: BLUE CROSS/BLUE SHIELD | Admitting: Family Medicine

## 2019-01-29 VITALS — BP 120/62 | HR 70 | Ht 63.0 in | Wt 123.0 lb

## 2019-01-29 DIAGNOSIS — Z124 Encounter for screening for malignant neoplasm of cervix: Secondary | ICD-10-CM | POA: Diagnosis not present

## 2019-01-29 DIAGNOSIS — Z1151 Encounter for screening for human papillomavirus (HPV): Secondary | ICD-10-CM | POA: Diagnosis not present

## 2019-01-29 DIAGNOSIS — Z01419 Encounter for gynecological examination (general) (routine) without abnormal findings: Secondary | ICD-10-CM | POA: Diagnosis not present

## 2019-01-29 NOTE — Patient Instructions (Signed)

## 2019-01-29 NOTE — Progress Notes (Signed)
GYNECOLOGY ANNUAL PREVENTATIVE CARE ENCOUNTER NOTE  Subjective:   Kimberly Valenzuela is a 63 y.o. [phone] female here for a routine annual gynecologic exam.  Current complaints: none.   Denies abnormal vaginal bleeding, discharge, pelvic pain, problems with intercourse or other gynecologic concerns.    Gynecologic History No LMP recorded. Patient is postmenopausal. Patient is not sexually active  Contraception: post menopausal status Last Pap: 2016. Results were: normal Last mammogram: 2020. Results were: normal  Obstetric History OB History  Gravida Para Term Preterm AB Living  3 1 1   2 1   SAB TAB Ectopic Multiple Live Births    2          # Outcome Date GA Lbr Len/2nd Weight Sex Delivery Anes PTL Lv  3 TAB           2 TAB           1 Term             Past Medical History:  Diagnosis Date  . Anxiety 08/03/2011  . Back pain 2015   MRI, local injection, better after injection  . Dyslipidemia    high TG , dx ~ 01-2011  . Hypertension   . Menopause    after BCP were d/c ~ 2010  . Osteoporosis     Past Surgical History:  Procedure Laterality Date  . CESAREAN SECTION    . COLONOSCOPY    . FACELIFT  11/2018  . POLYPECTOMY      Current Outpatient Medications on File Prior to Visit  Medication Sig Dispense Refill  . atorvastatin (LIPITOR) 20 MG tablet Take 1 tablet (20 mg total) by mouth at bedtime. 30 tablet 2  . Cholecalciferol (VITAMIN D) 1000 UNITS capsule Take 1,000 Units by mouth daily.      . clorazepate (TRANXENE) 7.5 MG tablet Take 1 tablet (7.5 mg total) by mouth 3 (three) times daily as needed. 270 tablet 0  . denosumab (PROLIA) 60 MG/ML SOLN injection Inject 60 mg into the skin every 6 (six) months. Administer in upper arm, thigh, or abdomen    . lisinopril-hydrochlorothiazide (PRINZIDE,ZESTORETIC) 10-12.5 MG tablet Take 1 tablet by mouth daily. 90 tablet 2  . Multiple Vitamins-Calcium (ONE-A-DAY WOMENS FORMULA) TABS Take 1 each by mouth daily. Womens  Menopause Formula      No current facility-administered medications on file prior to visit.     No Known Allergies  Social History   Socioeconomic History  . Marital status: Widowed    Spouse name: Not on file  . Number of children: 1  . Years of education: Not on file  . Highest education level: Not on file  Occupational History  . Occupation: retired/ Museum/gallery conservator Needs  . Financial resource strain: Not on file  . Food insecurity:    Worry: Not on file    Inability: Not on file  . Transportation needs:    Medical: Not on file    Non-medical: Not on file  Tobacco Use  . Smoking status: Current Every Day Smoker    Packs/day: 0.50    Years: 40.00    Pack years: 20.00    Types: Cigarettes  . Smokeless tobacco: Never Used  . Tobacco comment: ~ 1/2 ppd  Substance and Sexual Activity  . Alcohol use: Yes    Comment: socially   . Drug use: Yes    Frequency: 4.0 times per week    Types: Marijuana    Comment: last  smoked yesterday marijuana  . Sexual activity: Not Currently    Birth control/protection: Post-menopausal  Lifestyle  . Physical activity:    Days per week: Not on file    Minutes per session: Not on file  . Stress: Not on file  Relationships  . Social connections:    Talks on phone: Not on file    Gets together: Not on file    Attends religious service: Not on file    Active member of club or organization: Not on file    Attends meetings of clubs or organizations: Not on file    Relationship status: Not on file  . Intimate partner violence:    Fear of current or ex partner: Not on file    Emotionally abused: Not on file    Physically abused: Not on file    Forced sexual activity: Not on file  Other Topics Concern  . Not on file  Social History Narrative   Lost parents 2014   Lost her husband ~ 01-2018   Lives by herself   To retire 06-2018    Family History  Problem Relation Age of Onset  . Ovarian cancer Mother 67  . Colon cancer Maternal  Grandmother 110       dx in her 73s  . Renal cancer Maternal Grandfather        dx in his 32s  . Lung cancer Maternal Uncle        smoker  . Diabetes Neg Hx   . Coronary artery disease Neg Hx   . Stroke Neg Hx   . Breast cancer Neg Hx   . Esophageal cancer Neg Hx   . Liver cancer Neg Hx   . Pancreatic cancer Neg Hx   . Rectal cancer Neg Hx   . Stomach cancer Neg Hx     The following portions of the patient's history were reviewed and updated as appropriate: allergies, current medications, past family history, past medical history, past social history, past surgical history and problem list.  Review of Systems Pertinent items are noted in HPI.   Objective:  BP 120/62   Pulse 70   Ht 5\' 3"  (1.6 m)   Wt 123 lb (55.8 kg)   BMI 21.79 kg/m  Wt Readings from Last 3 Encounters:  01/29/19 123 lb (55.8 kg)  01/05/19 122 lb 6 oz (55.5 kg)  07/14/18 124 lb (56.2 kg)     CONSTITUTIONAL: Well-developed, well-nourished female in no acute distress.  HENT:  Normocephalic, atraumatic, External right and left ear normal. Oropharynx is clear and moist EYES: Conjunctivae and EOM are normal. Pupils are equal, round, and reactive to light. No scleral icterus.  NECK: Normal range of motion, supple, no masses.  Normal thyroid.   CARDIOVASCULAR: Normal heart rate noted, regular rhythm RESPIRATORY: Clear to auscultation bilaterally. Effort and breath sounds normal, no problems with respiration noted. BREASTS: Symmetric in size. No masses, skin changes, nipple drainage, or lymphadenopathy. ABDOMEN: Soft, normal bowel sounds, no distention noted.  No tenderness, rebound or guarding.  PELVIC: Normal appearing external genitalia; normal appearing vaginal mucosa and cervix.  No abnormal discharge noted.  Normal uterine size, no other palpable masses, no uterine or adnexal tenderness. MUSCULOSKELETAL: Normal range of motion. No tenderness.  No cyanosis, clubbing, or edema.  2+ distal pulses. SKIN: Skin  is warm and dry. No rash noted. Not diaphoretic. No erythema. No pallor. NEUROLOGIC: Alert and oriented to person, place, and time. Normal reflexes, muscle tone coordination. No cranial  nerve deficit noted. PSYCHIATRIC: Normal mood and affect. Normal behavior. Normal judgment and thought content.  Assessment:  Annual gynecologic examination with pap smear   Plan:  1. Well Woman Exam Will follow up results of pap smear and manage accordingly. - Cytology - PAP( [facility])   Routine preventative health maintenance measures emphasized. Please refer to After Visit Summary for other counseling recommendations.    Loma [place], [place] for [employer]

## 2019-01-29 NOTE — Progress Notes (Signed)
Last mammogram: 01/06/2019: Normal Last pap: 03/14/15- Negative

## 2019-02-02 LAB — CYTOLOGY - PAP
Diagnosis: NEGATIVE
HPV: NOT DETECTED

## 2019-02-10 ENCOUNTER — Telehealth: Payer: Self-pay | Admitting: Internal Medicine

## 2019-02-10 NOTE — Telephone Encounter (Signed)
Pt is requesting refill on clorazepate.   Last OV: 01/05/2019 Last Fill: 07/14/2018 #270 and 0RF UDS: 01/05/2019 Low risk

## 2019-02-10 NOTE — Telephone Encounter (Signed)
Sent!

## 2019-03-06 ENCOUNTER — Other Ambulatory Visit: Payer: Self-pay

## 2019-03-06 ENCOUNTER — Ambulatory Visit (INDEPENDENT_AMBULATORY_CARE_PROVIDER_SITE_OTHER): Payer: BLUE CROSS/BLUE SHIELD

## 2019-03-06 DIAGNOSIS — M810 Age-related osteoporosis without current pathological fracture: Secondary | ICD-10-CM | POA: Diagnosis not present

## 2019-03-06 DIAGNOSIS — Z23 Encounter for immunization: Secondary | ICD-10-CM

## 2019-03-06 MED ORDER — DENOSUMAB 60 MG/ML ~~LOC~~ SOSY
60.0000 mg | PREFILLED_SYRINGE | Freq: Once | SUBCUTANEOUS | Status: AC
  Administered 2019-03-06: 60 mg via SUBCUTANEOUS

## 2019-03-06 NOTE — Addendum Note (Signed)
Addended by: Bunnie Domino on: 03/06/2019 10:58 AM   Modules accepted: Orders

## 2019-03-09 ENCOUNTER — Telehealth: Payer: Self-pay | Admitting: Internal Medicine

## 2019-03-09 NOTE — Telephone Encounter (Signed)
Due for labs, started Lipitor, please arrange

## 2019-03-10 NOTE — Telephone Encounter (Signed)
Spoke w/ Pt- scheduled routine f/u virtual visit for tomorrow.

## 2019-03-10 NOTE — Progress Notes (Signed)
Kimberly November, MD

## 2019-03-11 ENCOUNTER — Other Ambulatory Visit: Payer: Self-pay

## 2019-03-11 ENCOUNTER — Ambulatory Visit (INDEPENDENT_AMBULATORY_CARE_PROVIDER_SITE_OTHER): Payer: BLUE CROSS/BLUE SHIELD | Admitting: Internal Medicine

## 2019-03-11 DIAGNOSIS — I10 Essential (primary) hypertension: Secondary | ICD-10-CM | POA: Diagnosis not present

## 2019-03-11 DIAGNOSIS — E785 Hyperlipidemia, unspecified: Secondary | ICD-10-CM | POA: Diagnosis not present

## 2019-03-11 NOTE — Progress Notes (Signed)
Subjective:    Patient ID: Kimberly Valenzuela, female    DOB: [birth year], 63 y.o.   MRN: [id number]  DOS:  03/11/2019 Type of visit - description: Virtual Visit via Video Note  I connected with@ on 03/11/19 at  4:00 PM EDT by a video enabled telemedicine application and verified that I am speaking with the correct person using two identifiers.   THIS ENCOUNTER IS A VIRTUAL VISIT DUE TO COVID-19 - PATIENT WAS NOT SEEN IN THE OFFICE. PATIENT HAS CONSENTED TO VIRTUAL VISIT / TELEMEDICINE VISIT   Location of patient: home  Location of provider: office  I discussed the limitations of evaluation and management by telemedicine and the availability of in person appointments. The patient expressed understanding and agreed to proceed.  History of Present Illness: Follow-up visit Since the last office visit, CT chest for lung cancer screening show coronary atherosclerosis.  Potassium was slightly elevated. She started Lipitor, reports good compliance without apparent side effects. COVID-19: Following all the appropriate precautions  Review of Systems Diet is regular She is trying to stay active taking walks around the neighborhood. No fever chills No cough No nausea or vomiting  Past Medical History:  Diagnosis Date  . Anxiety 08/03/2011  . Back pain 2015   MRI, local injection, better after injection  . Dyslipidemia    high TG , dx ~ 01-2011  . Hypertension   . Menopause    after BCP were d/c ~ 2010  . Osteoporosis     Past Surgical History:  Procedure Laterality Date  . CESAREAN SECTION    . COLONOSCOPY    . FACELIFT  11/2018  . POLYPECTOMY      Social History   Socioeconomic History  . Marital status: Widowed    Spouse name: Not on file  . Number of children: 1  . Years of education: Not on file  . Highest education level: Not on file  Occupational History  . Occupation: retired/ Museum/gallery conservator Needs  . Financial resource strain: Not on file  . Food insecurity:   Worry: Not on file    Inability: Not on file  . Transportation needs:    Medical: Not on file    Non-medical: Not on file  Tobacco Use  . Smoking status: Current Every Day Smoker    Packs/day: 0.50    Years: 40.00    Pack years: 20.00    Types: Cigarettes  . Smokeless tobacco: Never Used  . Tobacco comment: ~ 1/2 ppd  Substance and Sexual Activity  . Alcohol use: Yes    Comment: socially   . Drug use: Yes    Frequency: 4.0 times per week    Types: Marijuana    Comment: last smoked yesterday marijuana  . Sexual activity: Not Currently    Birth control/protection: Post-menopausal  Lifestyle  . Physical activity:    Days per week: Not on file    Minutes per session: Not on file  . Stress: Not on file  Relationships  . Social connections:    Talks on phone: Not on file    Gets together: Not on file    Attends religious service: Not on file    Active member of club or organization: Not on file    Attends meetings of clubs or organizations: Not on file    Relationship status: Not on file  . Intimate partner violence:    Fear of current or ex partner: Not on file    Emotionally  abused: Not on file    Physically abused: Not on file    Forced sexual activity: Not on file  Other Topics Concern  . Not on file  Social History Narrative   Lost parents 2014   Lost her husband ~ 01-2018   Lives by herself   To retire 06-2018      Allergies as of 03/11/2019   No Known Allergies     Medication List       Accurate as of March 11, 2019  3:58 PM. Always use your most recent med list.        atorvastatin 20 MG tablet Commonly known as:  LIPITOR Take 1 tablet (20 mg total) by mouth at bedtime.   clorazepate 7.5 MG tablet Commonly known as:  TRANXENE TAKE 1 TABLET(7.5 MG) BY MOUTH THREE TIMES DAILY AS NEEDED   denosumab 60 MG/ML Soln injection Commonly known as:  PROLIA Inject 60 mg into the skin every 6 (six) months. Administer in upper arm, thigh, or abdomen    lisinopril-hydrochlorothiazide 10-12.5 MG tablet Commonly known as:  ZESTORETIC Take 1 tablet by mouth daily.   One-A-Day Womens Formula Tabs Take 1 each by mouth daily. Womens Menopause Formula   Vitamin D 1000 units capsule Take 1,000 Units by mouth daily.           Objective:   Physical Exam There were no vitals taken for this visit. This is a video visit, alert oriented x3, no apparent distress    Assessment     Assessment HTN Increase creatinine: Baseline 1.2 >>> 1.59 (06-2015), wnl renal US (09-2015) Dyslipidemia Anxiety - tranxene  Back pain, 2015, had a MRI, improve after a local injection H/o iron deficiency anemia  Osteoporosis  --T score -2.8 (2013), unable to rx reclast --> started Boniva 2014 --T score -3.4 (03-2016)  worse d/t poor compliance versus no response to [place],  PROLIA #3 07-03-17 Menopause after BCP d/c  2010 Lipoma R upper back +FH ovarian cancer, GM and mother. Had genetic testing  2014 ---> negative   PLAN:  HTN: Currently on Zestoretic, last BMP okay except for a potassium of 5.3.  Recheck a BMP High cholesterol: Last LDL was 118, because CT scan showed coronary atherosclerosis I recommended Lipitor, good compliance, no apparent side effects.  Needs a FLP, AST, ALT. Osteoporosis, last Prolia done 03/06/2019 CT chest: Results discussed with the patient Plan: My nurse will schedule labs.   I discussed the assessment and treatment plan with the patient. The patient was provided an opportunity to ask questions and all were answered. The patient agreed with the plan and demonstrated an understanding of the instructions.   The patient was advised to call back or seek an in-person evaluation if the symptoms worsen or if the condition fails to improve as anticipated.

## 2019-03-12 NOTE — Assessment & Plan Note (Signed)
HTN: Currently on Zestoretic, last BMP okay except for a potassium of 5.3.  Recheck a BMP High cholesterol: Last LDL was 118, because CT scan showed coronary atherosclerosis I recommended Lipitor, good compliance, no apparent side effects.  Needs a FLP, AST, ALT. Osteoporosis, last Prolia done 03/06/2019 CT chest: Results discussed with the patient Plan: My nurse will schedule labs.

## 2019-03-13 NOTE — Addendum Note (Signed)
Addended byDamita Dunnings D on: 03/13/2019 07:58 AM   Modules accepted: Orders

## 2019-04-13 ENCOUNTER — Other Ambulatory Visit: Payer: Self-pay | Admitting: Internal Medicine

## 2019-04-19 ENCOUNTER — Other Ambulatory Visit: Payer: Self-pay | Admitting: Internal Medicine

## 2019-04-20 ENCOUNTER — Other Ambulatory Visit (INDEPENDENT_AMBULATORY_CARE_PROVIDER_SITE_OTHER): Payer: BLUE CROSS/BLUE SHIELD

## 2019-04-20 ENCOUNTER — Other Ambulatory Visit: Payer: Self-pay

## 2019-04-20 DIAGNOSIS — I10 Essential (primary) hypertension: Secondary | ICD-10-CM

## 2019-04-20 DIAGNOSIS — E785 Hyperlipidemia, unspecified: Secondary | ICD-10-CM

## 2019-04-20 LAB — ALT: ALT: 29 U/L (ref 0–35)

## 2019-04-20 LAB — BASIC METABOLIC PANEL
BUN: 24 mg/dL — ABNORMAL HIGH (ref 6–23)
CO2: 25 mEq/L (ref 19–32)
Calcium: 9.7 mg/dL (ref 8.4–10.5)
Chloride: 106 mEq/L (ref 96–112)
Creatinine, Ser: 1.28 mg/dL — ABNORMAL HIGH (ref 0.40–1.20)
GFR: 42.16 mL/min — ABNORMAL LOW (ref >60.00)
Glucose, Bld: 93 mg/dL (ref 70–99)
Potassium: 4.3 mEq/L (ref 3.5–5.1)
Sodium: 141 mEq/L (ref 135–145)

## 2019-04-20 LAB — LIPID PANEL
Cholesterol: 135 mg/dL (ref 0–200)
HDL: 65.9 mg/dL (ref >39.00)
NonHDL: 69.24
Total CHOL/HDL Ratio: 2
Triglycerides: 221 mg/dL — ABNORMAL HIGH (ref 0.0–149.0)
VLDL: 44.2 mg/dL — ABNORMAL HIGH (ref 0.0–40.0)

## 2019-04-20 LAB — AST: AST: 30 U/L (ref 0–37)

## 2019-04-20 LAB — LDL CHOLESTEROL, DIRECT: Direct LDL: 32 mg/dL

## 2019-07-08 ENCOUNTER — Telehealth: Payer: Self-pay | Admitting: Internal Medicine

## 2019-07-08 NOTE — Telephone Encounter (Signed)
Clorazepate Rx.   Last OV: 03/11/2019 Last Fill: 02/10/2019 #270 and 0RF Pt sig: 1 tab tid prn UDS: 01/05/2019 Low risk

## 2019-07-08 NOTE — Telephone Encounter (Signed)
Sent!

## 2019-07-22 ENCOUNTER — Other Ambulatory Visit: Payer: Self-pay | Admitting: Internal Medicine

## 2019-10-21 ENCOUNTER — Other Ambulatory Visit: Payer: Self-pay | Admitting: Internal Medicine

## 2020-01-07 ENCOUNTER — Encounter: Payer: Self-pay | Admitting: Internal Medicine

## 2020-01-07 ENCOUNTER — Ambulatory Visit (INDEPENDENT_AMBULATORY_CARE_PROVIDER_SITE_OTHER): Payer: BC Managed Care – PPO | Admitting: Internal Medicine

## 2020-01-07 DIAGNOSIS — E785 Hyperlipidemia, unspecified: Secondary | ICD-10-CM

## 2020-01-07 DIAGNOSIS — Z0000 Encounter for general adult medical examination without abnormal findings: Secondary | ICD-10-CM | POA: Diagnosis not present

## 2020-01-07 DIAGNOSIS — F17200 Nicotine dependence, unspecified, uncomplicated: Secondary | ICD-10-CM

## 2020-01-07 DIAGNOSIS — Z122 Encounter for screening for malignant neoplasm of respiratory organs: Secondary | ICD-10-CM

## 2020-01-07 NOTE — Progress Notes (Signed)
Subjective:    Patient ID: Kimberly Valenzuela, female    DOB: 09-03-1956, 64 y.o.   MRN: [id number]  DOS:  01/07/2020 Type of visit - description: Virtual Visit via Video Note  I connected with the above patient  by a video enabled telemedicine application and verified that I am speaking with the correct person using two identifiers.   THIS ENCOUNTER IS A VIRTUAL VISIT DUE TO COVID-19 - PATIENT WAS NOT SEEN IN THE OFFICE. PATIENT HAS CONSENTED TO VIRTUAL VISIT / TELEMEDICINE VISIT   Location of patient: home  Location of provider: office  I discussed the limitations of evaluation and management by telemedicine and the availability of in person appointments. The patient expressed understanding and agreed to proceed.   Here for CPX She has no major concerns. All her chronic medical problems were discussed    Review of Systems Specifically denies chest pain, difficulty breathing, cough   A 14 point review of systems is negative    Past Medical History:  Diagnosis Date  . Anxiety 08/03/2011  . Back pain 2015   MRI, local injection, better after injection  . Dyslipidemia    high TG , dx ~ 01-2011  . Hypertension   . Menopause    after BCP were d/c ~ 2010  . Osteoporosis     Past Surgical History:  Procedure Laterality Date  . CESAREAN SECTION    . COLONOSCOPY    . FACELIFT  11/2018  . POLYPECTOMY     Family History  Problem Relation Age of Onset  . Ovarian cancer Mother 59  . Colon cancer Maternal Grandmother 20       dx in her 58s  . Renal cancer Maternal Grandfather        dx in his 62s  . Lung cancer Maternal Uncle        smoker  . Diabetes Neg Hx   . Coronary artery disease Neg Hx   . Stroke Neg Hx   . Breast cancer Neg Hx   . Esophageal cancer Neg Hx   . Liver cancer Neg Hx   . Pancreatic cancer Neg Hx   . Rectal cancer Neg Hx   . Stomach cancer Neg Hx      Allergies as of 01/07/2020   No Known Allergies     Medication List       Accurate as of  January 07, 2020 11:02 AM. If you have any questions, ask your nurse or doctor.        atorvastatin 20 MG tablet Commonly known as: LIPITOR Take 1 tablet (20 mg total) by mouth at bedtime.   clorazepate 7.5 MG tablet Commonly known as: TRANXENE TAKE 1 TABLET(7.5 MG) BY MOUTH THREE TIMES DAILY AS NEEDED   denosumab 60 MG/ML Soln injection Commonly known as: PROLIA Inject 60 mg into the skin every 6 (six) months. Administer in upper arm, thigh, or abdomen   lisinopril-hydrochlorothiazide 10-12.5 MG tablet Commonly known as: ZESTORETIC Take 1 tablet by mouth daily.   One-A-Day Womens Formula Tabs Take 1 each by mouth daily. Womens Menopause Formula   Vitamin D 1000 units capsule Take 1,000 Units by mouth daily.             Objective:   Physical Exam There were no vitals taken for this visit. This is a virtual video visit, she is alert oriented x3, in no distress.    Assessment      Assessment HTN Increase creatinine: Baseline 1.2 >>>  1.59 (06-2015), wnl renal US (09-2015) Dyslipidemia Anxiety - tranxene  Back pain, 2015, had a MRI, improve after a local injection H/o iron deficiency anemia  Osteoporosis  --T score -2.8 (2013), unable to rx reclast --> started Boniva 2014 --T score -3.4 (03-2016)  worse d/t poor compliance versus no response to [place],  PROLIA #3 07-03-17 Menopause after BCP d/c  2010 Lipoma R upper back +FH ovarian cancer, GM and mother. Had genetic testing  2014 ---> negative Coronary calcifications per CT  PLAN:  Here for CPX HTN: No ambulatory BPs, will check her VS next week.  Continue Zestoretic. Dyslipidemia: On atorvastatin, good compliance and tolerance, checking labs Anxiety: Controlled on Tranxene Osteoporosis: Continue Prolia, taking a multivitamin including vitamin D. Coronary calcifications: Seen on CT, she has no symptoms, plan is to control her CV RFs RTC next week for a nurse visit, needs her vital signs check and labs. RTC  for a routine visit in 6 months     I discussed the assessment and treatment plan with the patient. The patient was provided an opportunity to ask questions and all were answered. The patient agreed with the plan and demonstrated an understanding of the instructions.   The patient was advised to call back or seek an in-person evaluation if the symptoms worsen or if the condition fails to improve as anticipated.

## 2020-01-07 NOTE — Assessment & Plan Note (Signed)
-   Tdap 2012 -  Flu shot, pnm shot (smoker) : declined  - s/p shingrix - covid vaccination d/w pt , encouraged to consider it, she is reluctant to proceed. -CCS: Cscope 04-2012--->  Polyps, Cscope 05/26/2018, next per GI -Female care  Last MMG 12/2018 H/o abnormal PAP x 1 in the 90s, f/u by normal ones, last PAP 01/2019 -Lung Ca screening: Last CT satisfactory, reschedule  -Diet and exercise discussed -Tobacco abuse:   Still smoking, counseled, consider nicotine supplements or medications.  not ready to quit  -Labs: CMP, FLP, CBC, CT chest.

## 2020-01-07 NOTE — Assessment & Plan Note (Signed)
Here for CPX HTN: No ambulatory BPs, will check her VS next week.  Continue Zestoretic. Dyslipidemia: On atorvastatin, good compliance and tolerance, checking labs Anxiety: Controlled on Tranxene Osteoporosis: Continue Prolia, taking a multivitamin including vitamin D. Coronary calcifications: Seen on CT, she has no symptoms, plan is to control her CV RFs RTC next week for a nurse visit, needs her vital signs check and labs. RTC for a routine visit in 6 months

## 2020-01-19 ENCOUNTER — Other Ambulatory Visit: Payer: Self-pay

## 2020-01-19 ENCOUNTER — Ambulatory Visit (INDEPENDENT_AMBULATORY_CARE_PROVIDER_SITE_OTHER): Payer: BC Managed Care – PPO | Admitting: Internal Medicine

## 2020-01-19 ENCOUNTER — Other Ambulatory Visit (INDEPENDENT_AMBULATORY_CARE_PROVIDER_SITE_OTHER): Payer: BC Managed Care – PPO

## 2020-01-19 DIAGNOSIS — Z0000 Encounter for general adult medical examination without abnormal findings: Secondary | ICD-10-CM | POA: Diagnosis not present

## 2020-01-19 DIAGNOSIS — I10 Essential (primary) hypertension: Secondary | ICD-10-CM | POA: Diagnosis not present

## 2020-01-19 DIAGNOSIS — E785 Hyperlipidemia, unspecified: Secondary | ICD-10-CM | POA: Diagnosis not present

## 2020-01-19 LAB — CBC WITH DIFFERENTIAL/PLATELET
Basophils Absolute: 0.1 10*3/uL (ref 0.0–0.1)
Basophils Relative: 0.6 % (ref 0.0–3.0)
Eosinophils Absolute: 0.3 10*3/uL (ref 0.0–0.7)
Eosinophils Relative: 4 % (ref 0.0–5.0)
HCT: 36.5 % (ref 36.0–46.0)
Hemoglobin: 12.3 g/dL (ref 12.0–15.0)
Lymphocytes Relative: 26.8 % (ref 12.0–46.0)
Lymphs Abs: 2.2 10*3/uL (ref 0.7–4.0)
MCHC: 33.6 g/dL (ref 30.0–36.0)
MCV: 105.8 fl — ABNORMAL HIGH (ref 78.0–100.0)
Monocytes Absolute: 0.4 10*3/uL (ref 0.1–1.0)
Monocytes Relative: 4.6 % (ref 3.0–12.0)
Neutro Abs: 5.3 10*3/uL (ref 1.4–7.7)
Neutrophils Relative %: 64 % (ref 43.0–77.0)
Platelets: 282 10*3/uL (ref 150.0–400.0)
RBC: 3.45 Mil/uL — ABNORMAL LOW (ref 3.87–5.11)
RDW: 13.5 % (ref 11.5–15.5)
WBC: 8.2 10*3/uL (ref 4.0–10.5)

## 2020-01-19 LAB — COMPREHENSIVE METABOLIC PANEL
ALT: 25 U/L (ref 0–35)
AST: 27 U/L (ref 0–37)
Albumin: 4.1 g/dL (ref 3.5–5.2)
Alkaline Phosphatase: 77 U/L (ref 39–117)
BUN: 30 mg/dL — ABNORMAL HIGH (ref 6–23)
CO2: 26 mEq/L (ref 19–32)
Calcium: 10.2 mg/dL (ref 8.4–10.5)
Chloride: 105 mEq/L (ref 96–112)
Creatinine, Ser: 1.47 mg/dL — ABNORMAL HIGH (ref 0.40–1.20)
GFR: 35.85 mL/min — ABNORMAL LOW (ref >60.00)
Glucose, Bld: 116 mg/dL — ABNORMAL HIGH (ref 70–99)
Potassium: 4.3 mEq/L (ref 3.5–5.1)
Sodium: 141 mEq/L (ref 135–145)
Total Bilirubin: 0.4 mg/dL (ref 0.2–1.2)
Total Protein: 6.4 g/dL (ref 6.0–8.3)

## 2020-01-19 LAB — LIPID PANEL
Cholesterol: 126 mg/dL (ref 0–200)
HDL: 64.7 mg/dL (ref >39.00)
LDL Cholesterol: 34 mg/dL (ref 0–99)
NonHDL: 61.1
Total CHOL/HDL Ratio: 2
Triglycerides: 136 mg/dL (ref 0.0–149.0)
VLDL: 27.2 mg/dL (ref 0.0–40.0)

## 2020-01-19 NOTE — Progress Notes (Signed)
Pt here for Blood pressure check per Dr. Larose Kells.  Pt currently takes: lisinopril/hctz 10/12.5mg    Pt reports compliance with medication.  BP today @ = 119/78 HR = 81  Pt advised per Dr. Larose Kells blood pressure looks good.  Continue medication, no changes.

## 2020-01-20 ENCOUNTER — Telehealth: Payer: Self-pay | Admitting: Internal Medicine

## 2020-01-20 ENCOUNTER — Ambulatory Visit (HOSPITAL_BASED_OUTPATIENT_CLINIC_OR_DEPARTMENT_OTHER)
Admission: RE | Admit: 2020-01-20 | Discharge: 2020-01-20 | Disposition: A | Discharge status: Home / Self Care | Payer: BC Managed Care – PPO | Source: Ambulatory Visit | Attending: Internal Medicine | Admitting: Internal Medicine

## 2020-01-20 ENCOUNTER — Ambulatory Visit (INDEPENDENT_AMBULATORY_CARE_PROVIDER_SITE_OTHER): Payer: BC Managed Care – PPO

## 2020-01-20 DIAGNOSIS — Z122 Encounter for screening for malignant neoplasm of respiratory organs: Secondary | ICD-10-CM | POA: Diagnosis not present

## 2020-01-20 DIAGNOSIS — F17200 Nicotine dependence, unspecified, uncomplicated: Secondary | ICD-10-CM | POA: Insufficient documentation

## 2020-01-20 DIAGNOSIS — M810 Age-related osteoporosis without current pathological fracture: Secondary | ICD-10-CM | POA: Diagnosis not present

## 2020-01-20 DIAGNOSIS — F17210 Nicotine dependence, cigarettes, uncomplicated: Secondary | ICD-10-CM | POA: Diagnosis not present

## 2020-01-20 MED ORDER — DENOSUMAB 60 MG/ML ~~LOC~~ SOSY
60.0000 mg | PREFILLED_SYRINGE | Freq: Once | SUBCUTANEOUS | Status: AC
  Administered 2020-01-20: 60 mg via SUBCUTANEOUS

## 2020-01-20 NOTE — Telephone Encounter (Signed)
PDMP okay, Rx sent 

## 2020-01-20 NOTE — Progress Notes (Addendum)
Pt here for Prolia shot.  Shot given in left arm. Pt handled fine. No concerns.   Kathlene November, MD

## 2020-01-20 NOTE — Telephone Encounter (Signed)
Clorazepate 7.5mg  refill request.   Last OV: 01/07/2020 Last Fill: 07/08/2019 #270 and 0RF Pt sig: 1 tab tid prn UDS: 01/05/2019 Low risk

## 2020-04-20 ENCOUNTER — Other Ambulatory Visit: Payer: Self-pay

## 2020-04-20 MED ORDER — ATORVASTATIN CALCIUM 20 MG PO TABS: 20.0000 mg | ORAL_TABLET | Freq: Every day | ORAL | 0 refills | Status: DC

## 2020-05-18 ENCOUNTER — Emergency Department (HOSPITAL_BASED_OUTPATIENT_CLINIC_OR_DEPARTMENT_OTHER): Payer: BC Managed Care – PPO

## 2020-05-18 ENCOUNTER — Encounter (HOSPITAL_BASED_OUTPATIENT_CLINIC_OR_DEPARTMENT_OTHER): Payer: Self-pay | Admitting: Emergency Medicine

## 2020-05-18 ENCOUNTER — Emergency Department (HOSPITAL_BASED_OUTPATIENT_CLINIC_OR_DEPARTMENT_OTHER)
Admission: EM | Admit: 2020-05-18 | Discharge: 2020-05-18 | Disposition: A | Discharge status: Home / Self Care | Payer: BC Managed Care – PPO | Attending: Emergency Medicine | Admitting: Emergency Medicine

## 2020-05-18 ENCOUNTER — Other Ambulatory Visit: Payer: Self-pay

## 2020-05-18 DIAGNOSIS — Y9289 Other specified places as the place of occurrence of the external cause: Secondary | ICD-10-CM | POA: Insufficient documentation

## 2020-05-18 DIAGNOSIS — I10 Essential (primary) hypertension: Secondary | ICD-10-CM | POA: Insufficient documentation

## 2020-05-18 DIAGNOSIS — X58XXXA Exposure to other specified factors, initial encounter: Secondary | ICD-10-CM | POA: Diagnosis not present

## 2020-05-18 DIAGNOSIS — F17210 Nicotine dependence, cigarettes, uncomplicated: Secondary | ICD-10-CM | POA: Diagnosis not present

## 2020-05-18 DIAGNOSIS — S53125A Posterior dislocation of left ulnohumeral joint, initial encounter: Secondary | ICD-10-CM | POA: Diagnosis not present

## 2020-05-18 DIAGNOSIS — Y9389 Activity, other specified: Secondary | ICD-10-CM | POA: Insufficient documentation

## 2020-05-18 DIAGNOSIS — Y998 Other external cause status: Secondary | ICD-10-CM | POA: Diagnosis not present

## 2020-05-18 DIAGNOSIS — Z79899 Other long term (current) drug therapy: Secondary | ICD-10-CM | POA: Diagnosis not present

## 2020-05-18 DIAGNOSIS — S60921A Unspecified superficial injury of right hand, initial encounter: Secondary | ICD-10-CM | POA: Diagnosis not present

## 2020-05-18 DIAGNOSIS — S53104A Unspecified dislocation of right ulnohumeral joint, initial encounter: Secondary | ICD-10-CM | POA: Insufficient documentation

## 2020-05-18 DIAGNOSIS — S53105D Unspecified dislocation of left ulnohumeral joint, subsequent encounter: Secondary | ICD-10-CM | POA: Diagnosis not present

## 2020-05-18 DIAGNOSIS — S53194A Other dislocation of right ulnohumeral joint, initial encounter: Secondary | ICD-10-CM | POA: Diagnosis not present

## 2020-05-18 DIAGNOSIS — W19XXXA Unspecified fall, initial encounter: Secondary | ICD-10-CM

## 2020-05-18 MED ORDER — MORPHINE SULFATE (PF) 4 MG/ML IV SOLN
4.0000 mg | Freq: Once | INTRAVENOUS | Status: AC
  Administered 2020-05-18: 4 mg via INTRAVENOUS
  Filled 2020-05-18: qty 1

## 2020-05-18 MED ORDER — FENTANYL CITRATE (PF) 100 MCG/2ML IJ SOLN
INTRAMUSCULAR | Status: AC | PRN
  Administered 2020-05-18: 100 ug via INTRAVENOUS

## 2020-05-18 MED ORDER — PROPOFOL 10 MG/ML IV BOLUS
INTRAVENOUS | Status: AC | PRN
  Administered 2020-05-18: 28.1 mg via INTRAVENOUS

## 2020-05-18 MED ORDER — SODIUM CHLORIDE 0.9 % IV BOLUS
1000.0000 mL | Freq: Once | INTRAVENOUS | Status: AC
  Administered 2020-05-18: 1000 mL via INTRAVENOUS

## 2020-05-18 MED ORDER — FENTANYL CITRATE (PF) 100 MCG/2ML IJ SOLN
100.0000 ug | Freq: Once | INTRAMUSCULAR | Status: DC
  Filled 2020-05-18: qty 2

## 2020-05-18 MED ORDER — PROPOFOL 10 MG/ML IV BOLUS
0.5000 mg/kg | Freq: Once | INTRAVENOUS | Status: DC
  Filled 2020-05-18: qty 20

## 2020-05-18 MED ORDER — ONDANSETRON HCL 4 MG/2ML IJ SOLN
4.0000 mg | Freq: Once | INTRAMUSCULAR | Status: AC
  Administered 2020-05-18: 4 mg via INTRAVENOUS
  Filled 2020-05-18: qty 2

## 2020-05-18 MED ORDER — HYDROCODONE-ACETAMINOPHEN 5-325 MG PO TABS: 1.0000 | ORAL_TABLET | Freq: Four times a day (QID) | ORAL | 0 refills | Status: DC | PRN

## 2020-05-18 MED FILL — HYDROCODON-APAP 5-325: 5-325 | 2 days supply | Qty: 15 | Fill #0

## 2020-05-18 NOTE — ED Notes (Signed)
NSR

## 2020-05-18 NOTE — ED Provider Notes (Signed)
[facility] Provider Note   CSN: [id number] Arrival date & time: 05/18/20  3474     History Chief Complaint  Patient presents with  . Arm Injury    Kimberly Valenzuela is a 64 y.o. female.  Right-hand-dominant.  She said she was walking a dog today and it pulled her down to the ground landing on her left elbow.  Complaining of severe left elbow pain.  Denies any other injuries other than some scrapes on her toe.  No numbness.  No head strike, not on anticoagulation.  The history is provided by the patient.  Arm Injury Location:  Elbow Elbow location:  L elbow Injury: yes   Mechanism of injury: fall   Fall:    Fall occurred:  Walking   Point of impact:  Outstretched arms   Entrapped after fall: no   Pain details:    Quality:  Aching   Severity:  Severe   Onset quality:  Sudden   Timing:  Constant Handedness:  Right-handed Dislocation: yes   Relieved by:  None tried Worsened by:  Movement Ineffective treatments:  None tried Associated symptoms: no back pain, no fever, no neck pain and no numbness        Past Medical History:  Diagnosis Date  . Anxiety 08/03/2011  . Back pain 2015   MRI, local injection, better after injection  . Dyslipidemia    high TG , dx ~ 01-2011  . Hypertension   . Menopause    after BCP were d/c ~ 2010  . Osteoporosis     Patient Active Problem List   Diagnosis Date Noted  . PCP NOTES >>>>>>>>>>>>>>>>> 06/05/2016  . Lipoma 07/05/2015  . Osteoporosis 05/04/2013  . Personal history of colonic polyp-adenoma 05/21/2012  . HTN (hypertension) 03/10/2012  . Hyperlipidemia 03/10/2012  . Anemia 03/10/2012  . Annual physical exam 08/03/2011  . Anxiety 08/03/2011    Past Surgical History:  Procedure Laterality Date  . CESAREAN SECTION    . COLONOSCOPY    . FACELIFT  11/2018  . POLYPECTOMY       OB History    Gravida  3   Para  1   Term  1   Preterm      AB  2   Living  1     SAB      TAB  2    Ectopic      Multiple      Live Births              Family History  Problem Relation Age of Onset  . Ovarian cancer Mother 4  . Colon cancer Maternal Grandmother 28       dx in her 19s  . Renal cancer Maternal Grandfather        dx in his 59s  . Lung cancer Maternal Uncle        smoker  . Diabetes Neg Hx   . Coronary artery disease Neg Hx   . Stroke Neg Hx   . Breast cancer Neg Hx   . Esophageal cancer Neg Hx   . Liver cancer Neg Hx   . Pancreatic cancer Neg Hx   . Rectal cancer Neg Hx   . Stomach cancer Neg Hx     Social History   Tobacco Use  . Smoking status: Current Every Day Smoker    Packs/day: 1.00    Years: 40.00    Pack years: 40.00    Types:  Cigarettes  . Smokeless tobacco: Never Used  . Tobacco comment: ~ 1/2 ppd  Vaping Use  . Vaping Use: Never used  Substance Use Topics  . Alcohol use: Yes    Comment: socially   . Drug use: Yes    Frequency: 4.0 times per week    Types: Marijuana    Comment: last smoked yesterday marijuana    Home Medications Prior to Admission medications   Medication Sig Start Date End Date Taking? Authorizing Provider  atorvastatin (LIPITOR) 20 MG tablet Take 1 tablet (20 mg total) by mouth at bedtime. 04/20/20   Colon Branch, MD  clorazepate (TRANXENE) 7.5 MG tablet TAKE 1 TABLET(7.5 MG) BY MOUTH THREE TIMES DAILY AS NEEDED 01/20/20   Colon Branch, MD  denosumab (PROLIA) 60 MG/ML SOLN injection Inject 60 mg into the skin every 6 (six) months. Administer in upper arm, thigh, or abdomen    [provider]  lisinopril-hydrochlorothiazide (ZESTORETIC) 10-12.5 MG tablet Take 1 tablet by mouth daily. 01/20/20   Colon Branch, MD  Multiple Vitamins-Calcium (ONE-A-DAY WOMENS FORMULA) TABS Take 1 each by mouth daily. Womens Menopause Formula     [provider]    Allergies    Patient has no known allergies.  Review of Systems   Review of Systems  Constitutional: Negative for fever.  HENT: Negative for sore throat.    Eyes: Negative for visual disturbance.  Respiratory: Negative for shortness of breath.   Cardiovascular: Negative for chest pain.  Gastrointestinal: Negative for abdominal pain.  Genitourinary: Negative for dysuria.  Musculoskeletal: Negative for back pain and neck pain.  Skin: Negative for rash.  Neurological: Negative for headaches.    Physical Exam Updated Vital Signs BP 109/70 (BP Location: Right Arm)   Pulse (!) 57   Temp 98.7 F (37.1 C) (Oral)   Resp 16   Ht 5\' 3"  (1.6 m)   Wt 56.2 kg   SpO2 98%   BMI 21.97 kg/m   Physical Exam Vitals and nursing note reviewed.  Constitutional:      General: She is not in acute distress.    Appearance: She is well-developed.  HENT:     Head: Normocephalic and atraumatic.  Eyes:     Conjunctiva/sclera: Conjunctivae normal.  Cardiovascular:     Rate and Rhythm: Normal rate and regular rhythm.     Heart sounds: No murmur heard.   Pulmonary:     Effort: Pulmonary effort is normal. No respiratory distress.     Breath sounds: Normal breath sounds.  Abdominal:     Palpations: Abdomen is soft.     Tenderness: There is no abdominal tenderness.  Musculoskeletal:        General: Tenderness, deformity and signs of injury present.     Cervical back: Neck supple.     Comments: Left upper extremity shoulder wrist hand nontender.  Marked deformity of left elbow.  No open wounds.  Radial pulse 2+.  Radial ulnar median sensation and motor intact distally.  Skin:    General: Skin is warm and dry.     Capillary Refill: Capillary refill takes less than 2 seconds.  Neurological:     General: No focal deficit present.     Mental Status: She is alert.     Sensory: No sensory deficit.     Motor: No weakness.     ED Results / Procedures / Treatments   Labs (all labs ordered are listed, but only abnormal results are displayed) Labs Reviewed -  No data to display  EKG None  Radiology DG Elbow 2 Views Left  Result Date:  05/18/2020 CLINICAL DATA:  Status post reduction EXAM: LEFT ELBOW - 1 VIEW COMPARISON:  Film from earlier in the same day. FINDINGS: There is been interval reduction of the elbow dislocation seen previously. No definitive fracture is noted. Splinting material is noted in place. IMPRESSION: Interval reduction of elbow dislocation. Electronically Signed   By: Inez Catalina M.D.   On: 05/18/2020 12:20   DG Elbow Complete Left  Result Date: 05/18/2020 CLINICAL DATA:  Posttraumatic arm pain EXAM: LEFT ELBOW - COMPLETE 3+ VIEW COMPARISON:  None. FINDINGS: Posterior dislocation of the elbow with some rotatory component. No visible fracture deformity. IMPRESSION: Dislocated elbow. Electronically Signed   By: Monte Fantasia M.D.   On: 05/18/2020 10:21    Procedures .Sedation  Date/Time: 05/18/2020 10:55 AM Performed by: Hayden Rasmussen, MD Authorized by: Hayden Rasmussen, MD   Consent:    Consent obtained:  Verbal and written   Consent given by:  Patient   Risks discussed:  Allergic reaction, dysrhythmia, inadequate sedation, nausea, prolonged hypoxia resulting in organ damage, prolonged sedation necessitating reversal, respiratory compromise necessitating ventilatory assistance and intubation and vomiting   Alternatives discussed:  Analgesia without sedation, anxiolysis and regional anesthesia Universal protocol:    Procedure explained and questions answered to patient or proxy's satisfaction: yes     Relevant documents present and verified: yes     Test results available and properly labeled: yes     Imaging studies available: yes     Required blood products, implants, devices, and special equipment available: yes     Site/side marked: yes     Immediately prior to procedure a time out was called: yes     Patient identity confirmation method:  Verbally with patient Indications:    Procedure necessitating sedation performed by:  Physician performing sedation Pre-sedation assessment:    Time  since last food or drink:  4   ASA classification: class 1 - normal, healthy patient     Neck mobility: normal     Mouth opening:  3 or more finger widths   Thyromental distance:  4 finger widths   Mallampati score:  II - soft palate, uvula, fauces visible   Pre-sedation assessments completed and reviewed: airway patency, cardiovascular function, hydration status, mental status, nausea/vomiting, pain level, respiratory function and temperature     Pre-sedation assessment completed:  05/18/2020 10:55 AM Immediate pre-procedure details:    Reassessment: Patient reassessed immediately prior to procedure     Reviewed: vital signs, relevant labs/tests and NPO status     Verified: bag valve mask available, emergency equipment available, intubation equipment available, IV patency confirmed, oxygen available and suction available   Procedure details (see MAR for exact dosages):    Preoxygenation:  Nasal cannula   Sedation:  Propofol   Intended level of sedation: deep   Analgesia:  Fentanyl   Intra-procedure monitoring:  Blood pressure monitoring, cardiac monitor, continuous pulse oximetry, frequent LOC assessments, frequent vital sign checks and continuous capnometry   Intra-procedure events: none     Total Provider sedation time (minutes):  15 Post-procedure details:    Post-sedation assessment completed:  05/18/2020 11:45 AM   Attendance: Constant attendance by certified staff until patient recovered     Recovery: Patient returned to pre-procedure baseline     Post-sedation assessments completed and reviewed: airway patency, cardiovascular function, hydration status, mental status, nausea/vomiting, pain level, respiratory function and  temperature     Patient is stable for discharge or admission: yes     Patient tolerance:  Tolerated well, no immediate complications .Ortho Injury Treatment  Date/Time: 05/18/2020 10:56 AM Performed by: Hayden Rasmussen, MD Authorized by: Hayden Rasmussen, MD    Consent:    Consent obtained:  Written   Consent given by:  Patient   Risks discussed:  Fracture, irreducible dislocation, nerve damage, restricted joint movement, stiffness and vascular damage   Alternatives discussed:  No treatment and delayed treatmentInjury location: elbow Location details: left elbow Injury type: dislocation Dislocation type: posterior Pre-procedure neurovascular assessment: neurovascularly intact Pre-procedure distal perfusion: normal Pre-procedure neurological function: normal Pre-procedure range of motion: reduced  Anesthesia: Local anesthesia used: no  Patient sedated: Yes. Refer to sedation procedure documentation for details of sedation. Manipulation performed: yes Reduction method: direct traction and supination Reduction successful: yes X-ray confirmed reduction: yes Immobilization: splint Splint type: long arm Supplies used: Ortho-Glass Post-procedure neurovascular assessment: post-procedure neurovascularly intact Post-procedure distal perfusion: normal Post-procedure neurological function: normal Post-procedure range of motion: normal Patient tolerance: patient tolerated the procedure well with no immediate complications    (including critical care time)  Medications Ordered in ED Medications  morphine 4 MG/ML injection 4 mg (has no administration in time range)  ondansetron (ZOFRAN) injection 4 mg (has no administration in time range)    ED Course  I have reviewed the triage vital signs and the nursing notes.  Pertinent labs & imaging results that were available during my care of the patient were reviewed by me and considered in my medical decision making (see chart for details).  Clinical Course as of May 18 1920  Wed May 18, 2020  1041 Discussed with orthopedics Dr. Tamera Punt.  He is recommending trial of sedation and closed reduction in the ED.  Posterior long-arm and follow-up 2 weeks.  If unsuccessful please call back.   [MB]   1234 Reviewed results of x-ray with patient.  She has been splinted in a long-arm splint will be given a sling.  Her neurovascular status in her hand is intact.  Return instructions discussed.   [MB]    Clinical Course User Index [MB] Hayden Rasmussen, MD   MDM Rules/Calculators/A&P                         ddx includes fracture, dislocation, contusion.  Patient consented for conscious sedation orthopedic reduction of dislocation.  Patient tolerated procedure well.  Recovered quickly.  Normal CSMs after recovery.  Placed in long-arm splint by ED tech under my supervision.  Normal CSMs.  Sling.  Return instructions discussed. Final Clinical Impression(s) / ED Diagnoses Final diagnoses:  Closed dislocation of right elbow, initial encounter  Fall, initial encounter    Rx / DC Orders ED Discharge Orders         Ordered    HYDROcodone-acetaminophen (NORCO/VICODIN) 5-325 MG tablet  Every 6 hours PRN     Discontinue  Reprint     05/18/20 1237           Hayden Rasmussen, MD 05/18/20 1924

## 2020-05-18 NOTE — Discharge Instructions (Addendum)
You were seen in the emergency department for evaluation of injuries from a fall.  You had a dislocation of your right elbow.  This was treated with sedation and closed reduction.  You were placed in a splint and will need to keep this on until you follow-up with orthopedics.  You can use Tylenol for pain and we are sending you a prescription for some narcotic pain medicine if needed.  Ice to the affected area.  Return to the emergency department if worsened pain or any numbness.

## 2020-05-18 NOTE — ED Notes (Signed)
Pulled down by a dog this am  inj to left elbow  Deformity noted,  Pos radial pulse

## 2020-05-18 NOTE — ED Triage Notes (Signed)
Pt chasing a dog, grabbed the collar and it pulled her down, landing on her left elbow.  Deformity noted.

## 2020-05-30 ENCOUNTER — Encounter: Payer: Self-pay | Admitting: Internal Medicine

## 2020-06-01 DIAGNOSIS — M25522 Pain in left elbow: Secondary | ICD-10-CM | POA: Diagnosis not present

## 2020-07-22 ENCOUNTER — Other Ambulatory Visit: Payer: Self-pay | Admitting: Internal Medicine

## 2020-08-22 ENCOUNTER — Other Ambulatory Visit: Payer: Self-pay | Admitting: Internal Medicine

## 2020-08-25 ENCOUNTER — Other Ambulatory Visit: Payer: Self-pay | Admitting: Internal Medicine

## 2020-08-31 ENCOUNTER — Telehealth: Payer: Self-pay | Admitting: Internal Medicine

## 2020-08-31 NOTE — Telephone Encounter (Signed)
Last OV 01/2020- needs visit for refills please.

## 2020-08-31 NOTE — Telephone Encounter (Signed)
CallerJayda Valenzuela  Call Back # [phone]  Medication: clorazepate (TRANXENE) 7.5 MG tablet      Has the patient contacted their pharmacy? YES (If no, request that the patient contact the pharmacy for the refill.) (If yes, when and what did the pharmacy advise?) -Pt called today and the pharmacy recommended she reach out to PCP for refill   Preferred Pharmacy (with phone number or street name):  [id number] - [place], [place] - [address]  [address] [place], [place] [address]  Phone:  [phone] Fax:  [phone]   Agent: Please be advised that RX refills may take up to 3 business days. We ask that you follow-up with your pharmacy.

## 2020-08-31 NOTE — Telephone Encounter (Signed)
I called her back and got her scheduled for OV!! Let me know if there is anything else I can do

## 2020-08-31 NOTE — Telephone Encounter (Signed)
TY

## 2020-09-02 ENCOUNTER — Ambulatory Visit: Payer: BC Managed Care – PPO | Admitting: Internal Medicine

## 2020-09-02 ENCOUNTER — Other Ambulatory Visit: Payer: Self-pay

## 2020-09-02 ENCOUNTER — Encounter: Payer: Self-pay | Admitting: Internal Medicine

## 2020-09-02 VITALS — BP 126/80 | HR 70 | Temp 98.2°F | Resp 16 | Ht 63.0 in | Wt 125.4 lb

## 2020-09-02 DIAGNOSIS — M810 Age-related osteoporosis without current pathological fracture: Secondary | ICD-10-CM

## 2020-09-02 DIAGNOSIS — Z79899 Other long term (current) drug therapy: Secondary | ICD-10-CM

## 2020-09-02 DIAGNOSIS — F419 Anxiety disorder, unspecified: Secondary | ICD-10-CM

## 2020-09-02 MED ORDER — DENOSUMAB 60 MG/ML ~~LOC~~ SOSY
60.0000 mg | PREFILLED_SYRINGE | Freq: Once | SUBCUTANEOUS | Status: AC
  Administered 2020-09-02: 60 mg via SUBCUTANEOUS

## 2020-09-02 MED ORDER — CLORAZEPATE DIPOTASSIUM 7.5 MG PO TABS: ORAL_TABLET | ORAL | 1 refills | Status: DC

## 2020-09-02 NOTE — Patient Instructions (Signed)
   GO TO THE FRONT DESK, PLEASE SCHEDULE YOUR APPOINTMENTS Come back for  A physical by 12/2020

## 2020-09-02 NOTE — Progress Notes (Signed)
Subjective:    Patient ID: Kimberly Valenzuela, female    DOB: 1956-09-24, 64 y.o.   MRN: [id number]  DOS:  09/02/2020 Type of visit - description: 1-month checkup Since the last office visit she is doing well. Has not proceeded with immunizations for Covid on influenza Needs a refill on Tranxene, she take it as needed.   BP Readings from Last 3 Encounters:  09/02/20 126/80  05/18/20 115/82  01/29/19 120/62     Review of Systems Denies chest pain no difficulty  Past Medical History:  Diagnosis Date  . Anxiety 08/03/2011  . Back pain 2015   MRI, local injection, better after injection  . Dyslipidemia    high TG , dx ~ 01-2011  . Hypertension   . Menopause    after BCP were d/c ~ 2010  . Osteoporosis     Past Surgical History:  Procedure Laterality Date  . CESAREAN SECTION    . COLONOSCOPY    . FACELIFT  11/2018  . POLYPECTOMY      Allergies as of 09/02/2020   No Known Allergies     Medication List       Accurate as of September 02, 2020 11:59 PM. If you have any questions, ask your nurse or doctor.        STOP taking these medications   HYDROcodone-acetaminophen 5-325 MG tablet Commonly known as: NORCO/VICODIN Stopped by: Kathlene November, MD     TAKE these medications   atorvastatin 20 MG tablet Commonly known as: LIPITOR Take 1 tablet (20 mg total) by mouth at bedtime.   clorazepate 7.5 MG tablet Commonly known as: TRANXENE TAKE 1 TABLET(7.5 MG) BY MOUTH THREE TIMES DAILY AS NEEDED   denosumab 60 MG/ML Soln injection Commonly known as: PROLIA Inject 60 mg into the skin every 6 (six) months. Administer in upper arm, thigh, or abdomen   lisinopril-hydrochlorothiazide 10-12.5 MG tablet Commonly known as: ZESTORETIC Take 1 tablet by mouth daily.   One-A-Day Womens Formula Tabs Take 1 each by mouth daily. Womens Menopause Formula          Objective:   Physical Exam BP 126/80 (BP Location: Right Arm, Patient Position: Sitting, Cuff Size: Small)   Pulse  70   Temp 98.2 F (36.8 C) (Oral)   Resp 16   Ht 5\' 3"  (1.6 m)   Wt 125 lb 6 oz (56.9 kg)   SpO2 97%   BMI 22.21 kg/m  General:   Well developed, NAD, BMI noted. HEENT:  Normocephalic . Face symmetric, atraumatic  Lower extremities: no pretibial edema bilaterally  Skin: Not pale. Not jaundice Neurologic:  alert & oriented X3.  Speech normal, gait appropriate for age and unassisted Psych--  Cognition and judgment appear intact.  Cooperative with normal attention span and concentration.  Behavior appropriate. No anxious or depressed appearing.      Assessment      Assessment HTN Increase creatinine: Baseline 1.2 >>> 1.59 (06-2015), wnl renal US (09-2015) Dyslipidemia Anxiety - tranxene  Back pain, 2015, had a MRI, improve after a local injection H/o iron deficiency anemia  Osteoporosis  --T score -2.8 (2013), unable to rx reclast --> started Boniva 2014 --T score -3.4 (03-2016)  worse d/t poor compliance versus no response to [place],  PROLIA #3 07-03-17 Menopause after BCP d/c  2010 Lipoma R upper back +FH ovarian cancer, GM and mother. Had genetic testing  2014 ---> negative Coronary calcifications per CT  PLAN:  HTN: Good med compliance, BP today  is very good, whenever he is checked outside the office is normal.  No change. Anxiety: On Tranxene, typically takes 1 a day, sometimes 2.  Rx provided, UDS today. Osteoporosis: Due for Prolia, provided today Preventive care: Has no proceed with COVID vaccine influenza shots, explained pro>> cons, she remains reluctant. RTC 12/2020 CPX      This visit occurred during the SARS-CoV-2 public health emergency.  Safety protocols were in place, including screening questions prior to the visit, additional usage of staff PPE, and extensive cleaning of exam room while observing appropriate contact time as indicated for disinfecting solutions.

## 2020-09-02 NOTE — Progress Notes (Signed)
Pre visit review using our clinic review tool, if applicable. No additional management support is needed unless otherwise documented below in the visit note. 

## 2020-09-04 NOTE — Assessment & Plan Note (Signed)
HTN: Good med compliance, BP today is very good, whenever he is checked outside the office is normal.  No change. Anxiety: On Tranxene, typically takes 1 a day, sometimes 2.  Rx provided, UDS today. Osteoporosis: Due for Prolia, provided today Preventive care: Has no proceed with COVID vaccine influenza shots, explained pro>> cons, she remains reluctant. RTC 12/2020 CPX

## 2020-09-05 LAB — DRUG MONITORING, PANEL 8 WITH CONFIRMATION, URINE
6 Acetylmorphine: NEGATIVE ng/mL (ref <10)
Alcohol Metabolites: POSITIVE ng/mL — AB
Alphahydroxyalprazolam: NEGATIVE ng/mL (ref <25)
Alphahydroxymidazolam: NEGATIVE ng/mL (ref <50)
Alphahydroxytriazolam: NEGATIVE ng/mL (ref <50)
Aminoclonazepam: NEGATIVE ng/mL (ref <25)
Amphetamines: NEGATIVE ng/mL (ref <500)
Benzodiazepines: POSITIVE ng/mL — AB (ref <100)
Buprenorphine, Urine: NEGATIVE ng/mL (ref <5)
Cocaine Metabolite: NEGATIVE ng/mL (ref <150)
Creatinine: 140.3 mg/dL
Ethyl Glucuronide (ETG): >100000 ng/mL — ABNORMAL HIGH (ref <500)
Ethyl Sulfate (ETS): 39784 ng/mL — ABNORMAL HIGH (ref <100)
Hydroxyethylflurazepam: NEGATIVE ng/mL (ref <50)
Lorazepam: NEGATIVE ng/mL (ref <50)
MDMA: NEGATIVE ng/mL (ref <500)
Marijuana Metabolite: 332 ng/mL — ABNORMAL HIGH (ref <5)
Marijuana Metabolite: POSITIVE ng/mL — AB (ref <20)
Nordiazepam: 89 ng/mL — ABNORMAL HIGH (ref <50)
Opiates: NEGATIVE ng/mL (ref <100)
Oxazepam: 2838 ng/mL — ABNORMAL HIGH (ref <50)
Oxidant: NEGATIVE ug/mL
Oxycodone: NEGATIVE ng/mL (ref <100)
Temazepam: NEGATIVE ng/mL (ref <50)
pH: 5.3 (ref 4.5–9.0)

## 2020-09-05 LAB — DM TEMPLATE

## 2020-10-17 ENCOUNTER — Telehealth: Payer: Self-pay | Admitting: Internal Medicine

## 2020-10-17 NOTE — Telephone Encounter (Signed)
Meds list fax to pt dentist, pt advised.

## 2020-10-17 NOTE — Telephone Encounter (Signed)
Caller Kimberly Valenzuela  Call Back # [phone]  Patient states she needs a list of medications sent to her periodontal dentist at  [facility] @ [phone]  Or email : [email or web address]

## 2020-10-18 ENCOUNTER — Other Ambulatory Visit: Payer: Self-pay | Admitting: Internal Medicine

## 2020-10-18 NOTE — Telephone Encounter (Signed)
Received fax dental extraction clearance request from [facility]. They are needing to know your recommendations when to do some tooth extractions d/t her bisphosphonate she takes q6 months. Form placed in red folder.

## 2020-10-24 NOTE — Telephone Encounter (Signed)
They asked specifically about previous use of biphosphonate's, she is currently on Prolia since approximately 2017. Answered: Okay to proceed anytime in between the Prolia injections every 6 months. History of bisphosphonates use increase the risk of dental problems

## 2020-10-24 NOTE — Telephone Encounter (Signed)
Form faxed back to [facility] at [phone]. Form sent for scanning. Received fax confirmation.

## 2020-11-16 ENCOUNTER — Other Ambulatory Visit: Payer: Self-pay | Admitting: Internal Medicine

## 2021-01-27 ENCOUNTER — Other Ambulatory Visit: Payer: Self-pay | Admitting: Internal Medicine

## 2021-02-04 ENCOUNTER — Telehealth: Payer: Self-pay | Admitting: Internal Medicine

## 2021-02-04 DIAGNOSIS — F17200 Nicotine dependence, unspecified, uncomplicated: Secondary | ICD-10-CM

## 2021-02-04 NOTE — Telephone Encounter (Signed)
Advise patient, it is time to redo the CT chest for lung cancer screening. Refer her to pulmonary.

## 2021-02-06 ENCOUNTER — Telehealth: Payer: Self-pay

## 2021-02-06 NOTE — Telephone Encounter (Signed)
Duplicate forms from December 2021 received from [facility]. Form refaxed to [phone]. Form sent for scanning.

## 2021-02-06 NOTE — Telephone Encounter (Signed)
Referral placed.

## 2021-03-24 ENCOUNTER — Encounter: Payer: Self-pay | Admitting: Internal Medicine

## 2021-03-24 ENCOUNTER — Other Ambulatory Visit: Payer: Self-pay

## 2021-03-24 ENCOUNTER — Ambulatory Visit (INDEPENDENT_AMBULATORY_CARE_PROVIDER_SITE_OTHER): Payer: BC Managed Care – PPO | Admitting: Internal Medicine

## 2021-03-24 VITALS — BP 126/80 | HR 84 | Temp 98.6°F | Resp 16 | Ht 63.0 in | Wt 125.1 lb

## 2021-03-24 DIAGNOSIS — Z1231 Encounter for screening mammogram for malignant neoplasm of breast: Secondary | ICD-10-CM

## 2021-03-24 DIAGNOSIS — I2510 Atherosclerotic heart disease of native coronary artery without angina pectoris: Secondary | ICD-10-CM

## 2021-03-24 DIAGNOSIS — Z23 Encounter for immunization: Secondary | ICD-10-CM | POA: Diagnosis not present

## 2021-03-24 DIAGNOSIS — F17200 Nicotine dependence, unspecified, uncomplicated: Secondary | ICD-10-CM

## 2021-03-24 DIAGNOSIS — E785 Hyperlipidemia, unspecified: Secondary | ICD-10-CM | POA: Diagnosis not present

## 2021-03-24 DIAGNOSIS — Z0001 Encounter for general adult medical examination with abnormal findings: Secondary | ICD-10-CM

## 2021-03-24 DIAGNOSIS — I10 Essential (primary) hypertension: Secondary | ICD-10-CM | POA: Diagnosis not present

## 2021-03-24 DIAGNOSIS — Z0000 Encounter for general adult medical examination without abnormal findings: Secondary | ICD-10-CM

## 2021-03-24 LAB — CBC WITH DIFFERENTIAL/PLATELET
Basophils Absolute: 0 10*3/uL (ref 0.0–0.1)
Basophils Relative: 0.5 % (ref 0.0–3.0)
Eosinophils Absolute: 0.3 10*3/uL (ref 0.0–0.7)
Eosinophils Relative: 4.6 % (ref 0.0–5.0)
HCT: 34.3 % — ABNORMAL LOW (ref 36.0–46.0)
Hemoglobin: 11.6 g/dL — ABNORMAL LOW (ref 12.0–15.0)
Lymphocytes Relative: 24.4 % (ref 12.0–46.0)
Lymphs Abs: 1.6 10*3/uL (ref 0.7–4.0)
MCHC: 33.7 g/dL (ref 30.0–36.0)
MCV: 103.8 fl — ABNORMAL HIGH (ref 78.0–100.0)
Monocytes Absolute: 0.4 10*3/uL (ref 0.1–1.0)
Monocytes Relative: 5.8 % (ref 3.0–12.0)
Neutro Abs: 4.2 10*3/uL (ref 1.4–7.7)
Neutrophils Relative %: 64.7 % (ref 43.0–77.0)
Platelets: 236 10*3/uL (ref 150.0–400.0)
RBC: 3.31 Mil/uL — ABNORMAL LOW (ref 3.87–5.11)
RDW: 14 % (ref 11.5–15.5)
WBC: 6.5 10*3/uL (ref 4.0–10.5)

## 2021-03-24 LAB — COMPREHENSIVE METABOLIC PANEL
ALT: 45 U/L — ABNORMAL HIGH (ref 0–35)
AST: 43 U/L — ABNORMAL HIGH (ref 0–37)
Albumin: 4.7 g/dL (ref 3.5–5.2)
Alkaline Phosphatase: 82 U/L (ref 39–117)
BUN: 27 mg/dL — ABNORMAL HIGH (ref 6–23)
CO2: 29 mEq/L (ref 19–32)
Calcium: 10.6 mg/dL — ABNORMAL HIGH (ref 8.4–10.5)
Chloride: 104 mEq/L (ref 96–112)
Creatinine, Ser: 1.37 mg/dL — ABNORMAL HIGH (ref 0.40–1.20)
GFR: 40.77 mL/min — ABNORMAL LOW (ref >60.00)
Glucose, Bld: 84 mg/dL (ref 70–99)
Potassium: 4.9 mEq/L (ref 3.5–5.1)
Sodium: 144 mEq/L (ref 135–145)
Total Bilirubin: 0.6 mg/dL (ref 0.2–1.2)
Total Protein: 7 g/dL (ref 6.0–8.3)

## 2021-03-24 LAB — LIPID PANEL
Cholesterol: 173 mg/dL (ref 0–200)
HDL: 79.2 mg/dL (ref >39.00)
LDL Cholesterol: 57 mg/dL (ref 0–99)
NonHDL: 93.33
Total CHOL/HDL Ratio: 2
Triglycerides: 183 mg/dL — ABNORMAL HIGH (ref 0.0–149.0)
VLDL: 36.6 mg/dL (ref 0.0–40.0)

## 2021-03-24 LAB — HEMOGLOBIN A1C: Hgb A1c MFr Bld: 5.6 % (ref 4.6–6.5)

## 2021-03-24 MED ORDER — ATORVASTATIN CALCIUM 20 MG PO TABS: 20.0000 mg | ORAL_TABLET | Freq: Every day | ORAL | 1 refills | Status: DC

## 2021-03-24 NOTE — Patient Instructions (Addendum)
We have placed an order for your mammogram. Please stop downstairs ([address]) before you leave to schedule.   Add aspirin 81 mg every day with breakfast  Test I am recommending: Mammogram CAT scan of your chest to assess for lung cancer Stress test, expect a phone call  Check the  blood pressure   BP GOAL is between 110/65 and  135/85. If it is consistently higher or lower, let me know    GO TO THE LAB : Get the blood work     [place], [place] back for   for a checkup in 6 months    "Living will", "[place] of attorney": Advanced care planning  (If you already have a living will or healthcare power of attorney, please bring the copy to be scanned in your chart.)  Advance care planning is a process that supports adults in  understanding and sharing their preferences regarding future medical care.   The patient's preferences are recorded in documents called Advance Directives.    Advanced directives are completed (and can be modified at any time) while the patient is in full mental capacity.   The documentation should be available at all times to the patient, the family and the healthcare providers.  Bring in a copy to be scanned in your chart is an excellent idea and is recommended   This legal documents direct treatment decision making and/or appoint a surrogate to make the decision if the patient is not capable to do so.    Advance directives can be documented in many types of formats,  documents have names such as:  Lliving will  Durable power of attorney for healthcare (healthcare proxy or healthcare power of attorney)  Combined directives  Physician orders for life-sustaining treatment    More information at:  [email or web address]     Steps to Quit Smoking Smoking tobacco is the leading cause of preventable death. It can affect almost every organ in the body. Smoking puts you and those around you at  risk for developing many serious chronic diseases. Quitting smoking can be difficult, but it is one of the best things that you can do for your health. It is never too late to quit. How do I get ready to quit? When you decide to quit smoking, create a plan to help you succeed. Before you quit:  Pick a date to quit. Set a date within the next 2 weeks to give you time to prepare.  Write down the reasons why you are quitting. Keep this list in places where you will see it often.  Tell your family, friends, and co-workers that you are quitting. Support from your loved ones can make quitting easier.  Talk with your health care provider about your options for quitting smoking.  Find out what treatment options are covered by your health insurance.  Identify people, places, things, and activities that make you want to smoke (triggers). Avoid them. What first steps can I take to quit smoking?  Throw away all cigarettes at home, at work, and in your car.  Throw away smoking accessories, such as Scientist, research (medical).  Clean your car. Make sure to empty the ashtray.  Clean your home, including curtains and carpets. What strategies can I use to quit smoking? Talk with your health care provider about combining strategies, such as taking medicines while you are also receiving in-person counseling. Using these two strategies together makes you more likely to  succeed in quitting than if you used either strategy on its own.  If you are pregnant or breastfeeding, talk with your health care provider about finding counseling or other support strategies to quit smoking. Do not take medicine to help you quit smoking unless your health care provider tells you to do so. To quit smoking: Quit right away  Quit smoking completely, instead of gradually reducing how much you smoke over a period of time. Research shows that stopping smoking right away is more successful than gradually quitting.  Attend in-person  counseling to help you build problem-solving skills. You are more likely to succeed in quitting if you attend counseling sessions regularly. Even short sessions of 10 minutes can be effective. Take medicine You may take medicines to help you quit smoking. Some medicines require a prescription and some you can purchase over-the-counter. Medicines may have nicotine in them to replace the nicotine in cigarettes. Medicines may:  Help to stop cravings.  Help to relieve withdrawal symptoms. Your health care provider may recommend:  Nicotine patches, gum, or lozenges.  Nicotine inhalers or sprays.  Non-nicotine medicine that is taken by mouth. Find resources Find resources and support systems that can help you to quit smoking and remain smoke-free after you quit. These resources are most helpful when you use them often. They include:  Online chats with a Social worker.  Telephone quitlines.  Printed Furniture conservator/restorer.  Support groups or group counseling.  Text messaging programs.  Mobile phone apps or applications. Use apps that can help you stick to your quit plan by providing reminders, tips, and encouragement. There are many free apps for mobile devices as well as websites. Examples include Quit Guide from the [employer] and smokefree.gov   What things can I do to make it easier to quit?  Reach out to your family and friends for support and encouragement. Call telephone quitlines (1-800-QUIT-NOW), reach out to support groups, or work with a counselor for support.  Ask people who smoke to avoid smoking around you.  Avoid places that trigger you to smoke, such as bars, parties, or smoke-break areas at work.  Spend time with people who do not smoke.  Lessen the stress in your life. Stress can be a smoking trigger for some people. To lessen stress, try: ? Exercising regularly. ? Doing deep-breathing exercises. ? Doing yoga. ? Meditating. ? Performing a body scan. This involves closing your  eyes, scanning your body from head to toe, and noticing which parts of your body are particularly tense. Try to relax the muscles in those areas.   How will I feel when I quit smoking? Day 1 to 3 weeks Within the first 24 hours of quitting smoking, you may start to feel withdrawal symptoms. These symptoms are usually most noticeable 2-3 days after quitting, but they usually do not last for more than 2-3 weeks. You may experience these symptoms:  Mood swings.  Restlessness, anxiety, or irritability.  Trouble concentrating.  Dizziness.  Strong cravings for sugary foods and nicotine.  Mild weight gain.  Constipation.  Nausea.  Coughing or a sore throat.  Changes in how the medicines that you take for unrelated issues work in your body.  Depression.  Trouble sleeping (insomnia). Week 3 and afterward After the first 2-3 weeks of quitting, you may start to notice more positive results, such as:  Improved sense of smell and taste.  Decreased coughing and sore throat.  Slower heart rate.  Lower blood pressure.  Clearer skin.  The  ability to breathe more easily.  Fewer sick days. Quitting smoking can be very challenging. Do not get discouraged if you are not successful the first time. Some people need to make many attempts to quit before they achieve long-term success. Do your best to stick to your quit plan, and talk with your health care provider if you have any questions or concerns. Summary  Smoking tobacco is the leading cause of preventable death. Quitting smoking is one of the best things that you can do for your health.  When you decide to quit smoking, create a plan to help you succeed.  Quit smoking right away, not slowly over a period of time.  When you start quitting, seek help from your health care provider, family, or friends. This information is not intended to replace advice given to you by your health care provider. Make sure you discuss any questions you  have with your health care provider. Document Revised: 07/31/2019 Document Reviewed: 01/24/2019 Elsevier Patient Education  [place].

## 2021-03-24 NOTE — Progress Notes (Signed)
Subjective:    Patient ID: Kimberly Valenzuela, female    DOB: [birth year], 65 y.o.   MRN: [id number]  DOS:  03/24/2021 Type of visit - description: CPX  CPX, has no concerns. We did extensive review of her cardiovascular risk factors. She is relatively active, walks her dog without any symptoms.  Review of Systems  Other than above, a 14 point review of systems is negative     Past Medical History:  Diagnosis Date  . Anxiety 08/03/2011  . Back pain 2015   MRI, local injection, better after injection  . Dyslipidemia    high TG , dx ~ 01-2011  . Hypertension   . Menopause    after BCP were d/c ~ 2010  . Osteoporosis     Past Surgical History:  Procedure Laterality Date  . CESAREAN SECTION    . COLONOSCOPY    . FACELIFT  11/2018  . POLYPECTOMY     Social History   Socioeconomic History  . Marital status: Widowed    Spouse name: Not on file  . Number of children: 1  . Years of education: Not on file  . Highest education level: Not on file  Occupational History  . Occupation: retired/ Press photographer   Tobacco Use  . Smoking status: Current Every Day Smoker    Packs/day: 1.00    Years: 40.00    Pack years: 40.00    Types: Cigarettes  . Smokeless tobacco: Never Used  . Tobacco comment: ~ 1/2 ppd  Vaping Use  . Vaping Use: Never used  Substance and Sexual Activity  . Alcohol use: Yes    Comment: socially   . Drug use: Yes    Frequency: 4.0 times per week    Types: Marijuana    Comment: last smoked yesterday marijuana  . Sexual activity: Not Currently    Birth control/protection: Post-menopausal  Other Topics Concern  . Not on file  Social History Narrative   Lost parents 2014   Lost her husband ~ 01-2018   Lives by herself       Social Determinants of Health   Financial Resource Strain: Not on file  Food Insecurity: Not on file  Transportation Needs: Not on file  Physical Activity: Not on file  Stress: Not on file  Social Connections: Not on file  Intimate  Partner Violence: Not on file    Allergies as of 03/24/2021   No Known Allergies     Medication List       Accurate as of Mar 24, 2021 11:59 PM. If you have any questions, ask your nurse or doctor.        aspirin 81 MG EC tablet Take 81 mg by mouth daily. Swallow whole.   atorvastatin 20 MG tablet Commonly known as: LIPITOR Take 1 tablet (20 mg total) by mouth at bedtime. What changed: additional instructions Changed by: Kathlene November, MD   clorazepate 7.5 MG tablet Commonly known as: TRANXENE TAKE 1 TABLET(7.5 MG) BY MOUTH THREE TIMES DAILY AS NEEDED   denosumab 60 MG/ML Soln injection Commonly known as: PROLIA Inject 60 mg into the skin every 6 (six) months. Administer in upper arm, thigh, or abdomen   lisinopril-hydrochlorothiazide 10-12.5 MG tablet Commonly known as: ZESTORETIC Take 1 tablet by mouth daily.   One-A-Day Womens Formula Tabs Take 1 each by mouth daily. Womens Menopause Formula          Objective:   Physical Exam BP 126/80 (BP Location: Left Arm, Patient Position:  Sitting, Cuff Size: Small)   Pulse 84   Temp 98.6 F (37 C) (Oral)   Resp 16   Ht 5\' 3"  (1.6 m)   Wt 125 lb 2 oz (56.8 kg)   SpO2 97%   BMI 22.16 kg/m  General: Well developed, NAD, BMI noted Neck: No  thyromegaly  HEENT:  Normocephalic . Face symmetric, atraumatic Lungs:  CTA B Normal respiratory effort, no intercostal retractions, no accessory muscle use. Heart: RRR,  no murmur.  Abdomen:  Not distended, soft, non-tender. No rebound or rigidity.   Lower extremities: no pretibial edema bilaterally  Skin: Exposed areas without rash. Not pale. Not jaundice Neurologic:  alert & oriented X3.  Speech normal, gait appropriate for age and unassisted Strength symmetric and appropriate for age.  Psych: Cognition and judgment appear intact.  Cooperative with normal attention span and concentration.  Behavior appropriate. No anxious or depressed appearing.     Assessment     Assessment HTN Increase creatinine: Baseline 1.2 >>> 1.59 (06-2015), wnl renal US (09-2015) Dyslipidemia Anxiety - tranxene  Back pain, 2015, had a MRI, improve after a local injection H/o iron deficiency anemia  Osteoporosis  --T score -2.8 (2013), unable to rx reclast --> started Boniva 2014 --T score -3.4 (03-2016)  worse d/t poor compliance versus no response to [place],  PROLIA #3 07-03-17 Menopause after BCP d/c  2010 Lipoma R upper back +FH ovarian cancer, GM and mother. Had genetic testing  2014 ---> negative Coronary calcifications per CT  PLAN:  HTN: On Zestoretic, ambulatory BPs reportedly good.  No change. Dyslipidemia: On Lipitor 20 mg daily, labs. Anxiety: On Tranxene as needed. Osteoporosis: Recommend calcium, vitamin D, increase physical activity, Prolia when due. CV RF:  The patient is a smoker, has coronary calcifications on chest CT (+ atherosclerosis), no sxs but is not very active, on Lipitor Plan: Lexiscan, start aspirin. RTC 6 months  In addition to CPX, we manage her chronic medical problems including extensive CV risk assessment, eventually recommend a Lexiscan and start aspirin. This visit occurred during the SARS-CoV-2 public health emergency.  Safety protocols were in place, including screening questions prior to the visit, additional usage of staff PPE, and extensive cleaning of exam room while observing appropriate contact time as indicated for disinfecting solutions.

## 2021-03-25 ENCOUNTER — Encounter: Payer: Self-pay | Admitting: Internal Medicine

## 2021-03-25 NOTE — Assessment & Plan Note (Signed)
-   Tdap 2012.  Booster today - PNM 23: Declined  - - s/p shingrix x 2  - covid vax: Remains reluctant, benefits discussed -CCS: Cscope 04-2012--->Polyps, Cscope 05/26/2018, next per GI -Female care  Last MMG 12/2018.  Order entered H/o abnormal PAP x 1 in the 90s, f/u by normal ones, last PAP 01/2019 -Lung Ca screening: Schedule CT -Diet and exercise discussed -Tobacco abuse:   Still smoking, counseled  -Labs: CMP, FLP, CBC,A1c -Advance directives discussed

## 2021-03-25 NOTE — Assessment & Plan Note (Addendum)
HTN: On Zestoretic, ambulatory BPs reportedly good.  No change. Dyslipidemia: On Lipitor 20 mg daily, labs. Anxiety: On Tranxene as needed. Osteoporosis: Recommend calcium, vitamin D, increase physical activity, Prolia when due. CV RF:  The patient is a smoker, has coronary calcifications on chest CT  (+ atherosclerosis)  , no sxs but is not very active, on Lipitor Plan: Lexiscan, start aspirin. RTC 6 months

## 2021-03-28 ENCOUNTER — Ambulatory Visit (HOSPITAL_BASED_OUTPATIENT_CLINIC_OR_DEPARTMENT_OTHER): Payer: BC Managed Care – PPO

## 2021-03-28 ENCOUNTER — Telehealth (HOSPITAL_COMMUNITY): Payer: Self-pay | Admitting: *Deleted

## 2021-03-28 NOTE — Telephone Encounter (Signed)
Left message on voicemail in reference to upcoming appointment scheduled for 03/31/21. Phone number given for a call back so details instructions can be given. Kimberly Valenzuela

## 2021-03-29 ENCOUNTER — Telehealth: Payer: Self-pay

## 2021-03-29 NOTE — Telephone Encounter (Signed)
Ok to schedule prolia shot.

## 2021-03-29 NOTE — Telephone Encounter (Signed)
Pt was here last week for physical- unsure she was set for her Prolia injection- last was on 09/02/2020. Let me know and I can call her to set up nurse visit.   Thank you.

## 2021-03-29 NOTE — Telephone Encounter (Signed)
LMOM informing Pt she is due for Prolia injection any time now. Asked that she call office to schedule nurse visit at her convenience.

## 2021-03-29 NOTE — Telephone Encounter (Signed)
Appt scheduled 04/04/21.

## 2021-03-30 ENCOUNTER — Other Ambulatory Visit: Payer: Self-pay | Admitting: *Deleted

## 2021-03-30 DIAGNOSIS — F17210 Nicotine dependence, cigarettes, uncomplicated: Secondary | ICD-10-CM

## 2021-03-31 ENCOUNTER — Ambulatory Visit (HOSPITAL_COMMUNITY): Payer: BC Managed Care – PPO

## 2021-03-31 ENCOUNTER — Other Ambulatory Visit: Payer: Self-pay

## 2021-04-04 ENCOUNTER — Other Ambulatory Visit: Payer: Self-pay

## 2021-04-04 ENCOUNTER — Ambulatory Visit (INDEPENDENT_AMBULATORY_CARE_PROVIDER_SITE_OTHER): Payer: BC Managed Care – PPO

## 2021-04-04 DIAGNOSIS — M810 Age-related osteoporosis without current pathological fracture: Secondary | ICD-10-CM

## 2021-04-04 MED ORDER — DENOSUMAB 60 MG/ML ~~LOC~~ SOSY
60.0000 mg | PREFILLED_SYRINGE | Freq: Once | SUBCUTANEOUS | Status: AC
  Administered 2021-04-04: 60 mg via SUBCUTANEOUS

## 2021-04-04 NOTE — Progress Notes (Signed)
Pt here for Prolia shot. Per Dr. Dinah Beers given in left arm. Pt tolerated well. No concerns. -JMA

## 2021-04-27 ENCOUNTER — Other Ambulatory Visit: Payer: Self-pay | Admitting: Internal Medicine

## 2021-05-10 ENCOUNTER — Ambulatory Visit (HOSPITAL_BASED_OUTPATIENT_CLINIC_OR_DEPARTMENT_OTHER): Payer: BC Managed Care – PPO

## 2021-05-10 ENCOUNTER — Other Ambulatory Visit: Payer: Self-pay

## 2021-05-10 ENCOUNTER — Encounter: Payer: BC Managed Care – PPO | Admitting: Acute Care

## 2021-05-10 ENCOUNTER — Telehealth: Payer: Self-pay | Admitting: *Deleted

## 2021-05-10 NOTE — Telephone Encounter (Signed)
Called and spoke with patient, she states she cancelled her CT scan for today and rescheduled for September because she will be on Medicare then and it will be paid for at that time and it will not be out of pocket for her.  She asked if she could just put off the visit until September since she has 3 months.  I spoke with Judson Roch and she said to go ahead and dot the visit and get it out of the way.  Advised patient and that Judson Roch would call her shortly.  She verbalized understanding.  Nothing further needed.

## 2021-05-10 NOTE — Patient Instructions (Signed)
Thank you for participating in the [facility]. It was our pleasure to meet you today. We will call you with the results of your scan within the next few days. Your scan will be assigned a Lung RADS category score by the physicians reading the scans.  This Lung RADS score determines follow up scanning.  See below for description of categories, and follow up screening recommendations. We will be in touch to schedule your follow up screening annually or based on recommendations of our providers. We will fax a copy of your scan results to your Primary Care Physician, or the physician who referred you to the program, to ensure they have the results. Please call the office if you have any questions or concerns regarding your scanning experience or results.  Our office number is [phone]. Please speak with Denise Phelps, RN. She is our Lung Cancer Screening RN. If she is unavailable when you call, please have the office staff send her a message. She will return your call at her earliest convenience. Remember, if your scan is normal, we will scan you annually as long as you continue to meet the criteria for the program. (Age 55-77, Current smoker or smoker who has quit within the last 15 years). If you are a smoker, remember, quitting is the single most powerful action that you can take to decrease your risk of lung cancer and other pulmonary, breathing related problems. We know quitting is hard, and we are here to help.  Please let us know if there is anything we can do to help you meet your goal of quitting. If you are a former smoker, congratulations. We are proud of you! Remain smoke free! Remember you can refer friends or family members through the number above.  We will screen them to make sure they meet criteria for the program. Thank you for helping us take better care of you by participating in Lung Screening.  Lung RADS Categories:  Lung RADS 1: no nodules  or definitely non-concerning nodules.  Recommendation is for a repeat annual scan in 12 months.  Lung RADS 2:  nodules that are non-concerning in appearance and behavior with a very low likelihood of becoming an active cancer. Recommendation is for a repeat annual scan in 12 months.  Lung RADS 3: nodules that are probably non-concerning , includes nodules with a low likelihood of becoming an active cancer.  Recommendation is for a 6-month repeat screening scan. Often noted after an upper respiratory illness. We will be in touch to make sure you have no questions, and to schedule your 6-month scan.  Lung RADS 4 A: nodules with concerning findings, recommendation is most often for a follow up scan in 3 months or additional testing based on our provider's assessment of the scan. We will be in touch to make sure you have no questions and to schedule the recommended 3 month follow up scan.  Lung RADS 4 B:  indicates findings that are concerning. We will be in touch with you to schedule additional diagnostic testing based on our provider's  assessment of the scan.   

## 2021-08-10 ENCOUNTER — Telehealth (HOSPITAL_COMMUNITY): Payer: Self-pay

## 2021-08-10 NOTE — Telephone Encounter (Signed)
Detailed instructions left on the patient's answering machine. Asked to call back with any questions. S.Dekendrick Uzelac EMTP 

## 2021-08-14 ENCOUNTER — Ambulatory Visit (HOSPITAL_BASED_OUTPATIENT_CLINIC_OR_DEPARTMENT_OTHER): Payer: Medicare Other

## 2021-08-15 ENCOUNTER — Encounter (HOSPITAL_COMMUNITY): Payer: BC Managed Care – PPO

## 2021-08-15 ENCOUNTER — Telehealth (HOSPITAL_COMMUNITY): Payer: Self-pay | Admitting: *Deleted

## 2021-08-15 NOTE — Telephone Encounter (Signed)
Left message on voicemail in reference to upcoming appointment scheduled for 08/21/21. Phone number given for a call back so details instructions can be given. Kimberly Valenzuela

## 2021-08-21 ENCOUNTER — Other Ambulatory Visit (HOSPITAL_COMMUNITY): Payer: Self-pay | Admitting: Internal Medicine

## 2021-08-21 ENCOUNTER — Encounter (HOSPITAL_COMMUNITY): Payer: Self-pay

## 2021-08-21 ENCOUNTER — Other Ambulatory Visit: Payer: Self-pay | Admitting: Internal Medicine

## 2021-08-21 ENCOUNTER — Other Ambulatory Visit: Payer: Self-pay

## 2021-08-21 ENCOUNTER — Ambulatory Visit (HOSPITAL_COMMUNITY): Payer: Medicare Other | Attending: Internal Medicine

## 2021-08-21 ENCOUNTER — Ambulatory Visit (HOSPITAL_COMMUNITY): Payer: Medicare Other

## 2021-08-21 VITALS — Ht 63.0 in | Wt 125.0 lb

## 2021-08-21 DIAGNOSIS — I2584 Coronary atherosclerosis due to calcified coronary lesion: Secondary | ICD-10-CM

## 2021-08-21 DIAGNOSIS — I2510 Atherosclerotic heart disease of native coronary artery without angina pectoris: Secondary | ICD-10-CM

## 2021-08-21 DIAGNOSIS — R110 Nausea: Secondary | ICD-10-CM | POA: Insufficient documentation

## 2021-08-21 LAB — MYOCARDIAL PERFUSION IMAGING
Base ST Depression (mm): 0 mm
LV dias vol: 18 mL (ref 46–106)
LV sys vol: 57 mL
Nuc Stress EF: 69 %
Peak HR: 94 {beats}/min
Rest HR: 53 {beats}/min
Rest Nuclear Isotope Dose: 10.8 mCi
SDS: 9
SRS: 3
SSS: 13
ST Depression (mm): 0 mm
Stress Nuclear Isotope Dose: 30.7 mCi
TID: 1.03

## 2021-08-21 MED ORDER — REGADENOSON 0.4 MG/5ML IV SOLN
0.4000 mg | Freq: Once | INTRAVENOUS | Status: AC
  Administered 2021-08-21: 0.4 mg via INTRAVENOUS

## 2021-08-21 MED ORDER — TECHNETIUM TC 99M TETROFOSMIN IV KIT
30.7000 | PACK | Freq: Once | INTRAVENOUS | Status: AC | PRN
Start: 2021-08-21 — End: 2021-08-21
  Administered 2021-08-21: 30.7 via INTRAVENOUS
  Filled 2021-08-21: qty 31

## 2021-08-21 MED ORDER — TECHNETIUM TC 99M TETROFOSMIN IV KIT
10.8000 | PACK | Freq: Once | INTRAVENOUS | Status: AC | PRN
  Administered 2021-08-21: 10.8 via INTRAVENOUS
  Filled 2021-08-21: qty 11

## 2021-08-21 MED ORDER — AMINOPHYLLINE 25 MG/ML IV SOLN
75.0000 mg | Freq: Once | INTRAVENOUS | Status: AC
  Administered 2021-08-21: 75 mg via INTRAVENOUS

## 2021-08-22 ENCOUNTER — Encounter: Payer: Self-pay | Admitting: Acute Care

## 2021-08-22 ENCOUNTER — Ambulatory Visit (INDEPENDENT_AMBULATORY_CARE_PROVIDER_SITE_OTHER): Payer: Medicare Other | Admitting: Acute Care

## 2021-08-22 DIAGNOSIS — F17210 Nicotine dependence, cigarettes, uncomplicated: Secondary | ICD-10-CM | POA: Diagnosis not present

## 2021-08-22 NOTE — Progress Notes (Signed)
Virtual Visit via Telephone Note  I connected with Kimberly Valenzuela on 08/22/21 at  1:30 PM EDT by telephone and verified that I am speaking with the correct person using two identifiers.  Location: Patient: Home Provider: Working from home    I discussed the limitations, risks, security and privacy concerns of performing an evaluation and management service by telephone and the availability of in person appointments. I also discussed with the patient that there may be a patient responsible charge related to this service. The patient expressed understanding and agreed to proceed.  Shared Decision Making Visit Lung Cancer Screening Program [phone])   Eligibility: Age 65 y.o. Pack Years Smoking History Calculation 22 (# packs/per year x # years smoked) Recent History of coughing up blood  no Unexplained weight loss? no ( >Than 15 pounds within the last 6 months ) Prior History Lung / other cancer no (Diagnosis within the last 5 years already requiring surveillance chest CT Scans). Smoking Status Current Smoker Former Smokers: Years since quit: NA  Quit Date: NA  Visit Components: Discussion included one or more decision making aids. yes Discussion included risk/benefits of screening. yes Discussion included potential follow up diagnostic testing for abnormal scans. yes Discussion included meaning and risk of over diagnosis. yes Discussion included meaning and risk of False Positives. yes Discussion included meaning of total radiation exposure. yes  Counseling Included: Importance of adherence to annual lung cancer LDCT screening. yes Impact of comorbidities on ability to participate in the program. yes Ability and willingness to under diagnostic treatment. yes  Smoking Cessation Counseling: Current Smokers:  Discussed importance of smoking cessation. yes Information about tobacco cessation classes and interventions provided to patient. yes Patient provided with "ticket" for LDCT  Scan. yes Symptomatic Patient. no  Counseling(Intermediate counseling: > three minutes) 99406 Diagnosis Code: Tobacco Use Z72.0 Asymptomatic Patient no  Counseling (Intermediate counseling: > three minutes counseling) E2683 Former Smokers:  Discussed the importance of maintaining cigarette abstinence. yes Diagnosis Code: Personal History of Nicotine Dependence. M19.622 Information about tobacco cessation classes and interventions provided to patient. Yes Patient provided with "ticket" for LDCT Scan. yes Written Order for Lung Cancer Screening with LDCT placed in Epic. Yes (CT Chest Lung Cancer Screening Low Dose W/O CM) [id number] Z12.2-Screening of respiratory organs Z87.891-Personal history of nicotine dependence   I spent 25 minutes of face to face time with her discussing the risks and benefits of lung cancer screening. We viewed a power point together that explained in detail the above noted topics. We took the time to pause the power point at intervals to allow for questions to be asked and answered to ensure understanding. We discussed that she had taken the single most powerful action possible to decrease her risk of developing lung cancer when she quit smoking. I counseled her to remain smoke free, and to contact me if she ever had the desire to smoke again so that I can provide resources and tools to help support the effort to remain smoke free. We discussed the time and location of the scan, and that either  Doroteo Glassman RN or I will call with the results within  24-48 hours of receiving them. she has my card and contact information in the event she needs to speak with me, in addition to a copy of the power point we reviewed as a resource. her verbalized understanding of all of the above and had no further questions upon leaving the office.     I explained to  the patient that there has been a high incidence of coronary artery disease noted on these exams. I explained that this is a  non-gated exam therefore degree or severity cannot be determined. This patient is not on statin therapy. I have asked the patient to follow-up with their PCP regarding any incidental finding of coronary artery disease and management with diet or medication as they feel is clinically indicated. The patient verbalized understanding of the above and had no further questions.   Kimberly Valenzuela D. Kimberly Kingfisher, NP-C [facility] Personal contact information can be found on Amion  08/22/2021, 12:51 PM

## 2021-08-28 ENCOUNTER — Ambulatory Visit (HOSPITAL_BASED_OUTPATIENT_CLINIC_OR_DEPARTMENT_OTHER)
Admission: RE | Admit: 2021-08-28 | Discharge: 2021-08-28 | Disposition: A | Discharge status: Home / Self Care | Payer: Medicare Other | Source: Ambulatory Visit | Attending: Internal Medicine | Admitting: Internal Medicine

## 2021-08-28 ENCOUNTER — Encounter (HOSPITAL_BASED_OUTPATIENT_CLINIC_OR_DEPARTMENT_OTHER): Payer: Self-pay

## 2021-08-28 ENCOUNTER — Other Ambulatory Visit: Payer: Self-pay

## 2021-08-28 ENCOUNTER — Telehealth: Payer: Self-pay | Admitting: Internal Medicine

## 2021-08-28 ENCOUNTER — Ambulatory Visit (HOSPITAL_BASED_OUTPATIENT_CLINIC_OR_DEPARTMENT_OTHER)
Admission: RE | Admit: 2021-08-28 | Discharge: 2021-08-28 | Disposition: A | Discharge status: Home / Self Care | Payer: Medicare Other | Source: Ambulatory Visit | Attending: Acute Care | Admitting: Acute Care

## 2021-08-28 DIAGNOSIS — Z1231 Encounter for screening mammogram for malignant neoplasm of breast: Secondary | ICD-10-CM | POA: Insufficient documentation

## 2021-08-28 DIAGNOSIS — F17210 Nicotine dependence, cigarettes, uncomplicated: Secondary | ICD-10-CM | POA: Insufficient documentation

## 2021-08-28 NOTE — Telephone Encounter (Signed)
PDMP okay, Rx sent 

## 2021-08-28 NOTE — Telephone Encounter (Signed)
Requesting: clorazepate 7.5mg  Contract: 01/05/2019 UDS: 09/02/2020 Last Visit: 03/24/2021 Next Visit: 09/25/2021 Last Refill: 09/02/2020 #270 and 1RF  Please Advise

## 2021-09-25 ENCOUNTER — Encounter: Payer: Self-pay | Admitting: Internal Medicine

## 2021-09-25 ENCOUNTER — Other Ambulatory Visit: Payer: Self-pay

## 2021-09-25 ENCOUNTER — Ambulatory Visit (INDEPENDENT_AMBULATORY_CARE_PROVIDER_SITE_OTHER): Payer: Medicare Other | Admitting: Internal Medicine

## 2021-09-25 ENCOUNTER — Other Ambulatory Visit: Payer: Self-pay | Admitting: Internal Medicine

## 2021-09-25 VITALS — BP 108/70 | HR 70 | Temp 98.4°F | Resp 16 | Ht 63.0 in | Wt 124.5 lb

## 2021-09-25 DIAGNOSIS — I10 Essential (primary) hypertension: Secondary | ICD-10-CM | POA: Diagnosis not present

## 2021-09-25 DIAGNOSIS — M810 Age-related osteoporosis without current pathological fracture: Secondary | ICD-10-CM | POA: Diagnosis not present

## 2021-09-25 DIAGNOSIS — Z79899 Other long term (current) drug therapy: Secondary | ICD-10-CM | POA: Diagnosis not present

## 2021-09-25 DIAGNOSIS — F419 Anxiety disorder, unspecified: Secondary | ICD-10-CM

## 2021-09-25 LAB — BASIC METABOLIC PANEL
BUN: 34 mg/dL — ABNORMAL HIGH (ref 6–23)
CO2: 26 mEq/L (ref 19–32)
Calcium: 10.3 mg/dL (ref 8.4–10.5)
Chloride: 106 mEq/L (ref 96–112)
Creatinine, Ser: 1.7 mg/dL — ABNORMAL HIGH (ref 0.40–1.20)
GFR: 31.35 mL/min — ABNORMAL LOW (ref >60.00)
Glucose, Bld: 99 mg/dL (ref 70–99)
Potassium: 4.9 mEq/L (ref 3.5–5.1)
Sodium: 139 mEq/L (ref 135–145)

## 2021-09-25 NOTE — Patient Instructions (Signed)
Check the  blood pressure regularly BP GOAL is between 110/65 and  135/85. If it is consistently higher or lower, let me know      GO TO THE LAB : Get the blood work     GO TO THE FRONT DESK, PLEASE SCHEDULE YOUR APPOINTMENTS Come back for   a physical exam in 6 months 

## 2021-09-25 NOTE — Progress Notes (Signed)
Subjective:    Patient ID: Kimberly Valenzuela, female    DOB: 23-Sep-1956, 65 y.o.   MRN: [id number]  DOS:  09/25/2021 Type of visit - description: f/u  Today with talk about hypertension, osteoporosis, anxiety. Overall feels well. Still smoking. Not exercising much. Had problems with a tooth implant.   Review of Systems See above   Past Medical History:  Diagnosis Date   Anxiety 08/03/2011   Back pain 2015   MRI, local injection, better after injection   Dyslipidemia    high TG , dx ~ 01-2011   Hypertension    Menopause    after BCP were d/c ~ 2010   Osteoporosis     Past Surgical History:  Procedure Laterality Date   CESAREAN SECTION     COLONOSCOPY     FACELIFT  11/2018   POLYPECTOMY      Allergies as of 09/25/2021   No Known Allergies      Medication List        Accurate as of September 25, 2021  8:30 AM. If you have any questions, ask your nurse or doctor.          aspirin 81 MG EC tablet Take 81 mg by mouth daily. Swallow whole.   atorvastatin 20 MG tablet Commonly known as: LIPITOR Take 1 tablet (20 mg total) by mouth at bedtime.   clorazepate 7.5 MG tablet Commonly known as: TRANXENE TAKE 1 TABLET BY MOUTH THREE TIMES DAILY AS NEEDED   denosumab 60 MG/ML Soln injection Commonly known as: PROLIA Inject 60 mg into the skin every 6 (six) months. Administer in upper arm, thigh, or abdomen   lisinopril-hydrochlorothiazide 10-12.5 MG tablet Commonly known as: ZESTORETIC Take 1 tablet by mouth daily.   One-A-Day Womens Formula Tabs Take 1 each by mouth daily. Womens Menopause Formula           Objective:   Physical Exam BP 108/70 (BP Location: Left Arm, Patient Position: Sitting, Cuff Size: Small)   Pulse 70   Temp 98.4 F (36.9 C) (Oral)   Resp 16   Ht 5\' 3"  (1.6 m)   Wt 124 lb 8 oz (56.5 kg)   SpO2 98%   BMI 22.05 kg/m  General:   Well developed, NAD, BMI noted. HEENT:  Normocephalic . Face symmetric, atraumatic Lungs:  CTA  B Normal respiratory effort, no intercostal retractions, no accessory muscle use. Heart: RRR,  no murmur.  Lower extremities: no pretibial edema bilaterally  Skin: Not pale. Not jaundice Neurologic:  alert & oriented X3.  Speech normal, gait appropriate for age and unassisted Psych--  Cognition and judgment appear intact.  Cooperative with normal attention span and concentration.  Behavior appropriate. No anxious or depressed appearing.      Assessment     Assessment HTN Increase creatinine: Baseline 1.2 >>> 1.59 (06-2015), wnl renal US (09-2015) Dyslipidemia Anxiety - tranxene  Back pain, 2015, had a MRI, improve after a local injection H/o iron deficiency anemia  Osteoporosis  --T score -2.8 (2013), unable to rx reclast --> started Boniva 2014 --T score -3.4 (03-2016)  worse d/t  poor compliance versus no response to [place],  PROLIA #3 07-03-17 Menopause after BCP d/c  2010 Lipoma R upper back +FH ovarian cancer, GM and mother. Had genetic testing  2014 ---> negative Coronary calcifications per CT >>> stress test-2022: Prior infarct, low risk.  Plan is CV RF control  PLAN:  HTN: BP today is excellent, at home is normal, continue  Zestoretic.  Check BMP Increased creatinine: Labs. High cholesterol, on Lipitor, well controlled. Anxiety: On Tranxene, UDS and contract today, she smokes marijuana recreationally. Osteoporosis: Recently, a  65 year old dental implant had to be removed due to infection.  She does not need any new implant or major dental surgery at this point.  She is concerned about taking Prolia, his dentist could not link Prolia as the culprit, she is concerned, we had a long conversation about the issue, I offer a Endo appointment for further advised regards osteoporosis treatment , she declined it.  We agreed to continue Prolia and if she decides to stop it she will let me know. Tobacco: Not ready to quit Preventive care: Has not changed her mind about taking a COVID  or influenza shot. RTC 6 months CPX   This visit occurred during the SARS-CoV-2 public health emergency.  Safety protocols were in place, including screening questions prior to the visit, additional usage of staff PPE, and extensive cleaning of exam room while observing appropriate contact time as indicated for disinfecting solutions.

## 2021-09-25 NOTE — Assessment & Plan Note (Signed)
HTN: BP today is excellent, at home is normal, continue Zestoretic.  Check BMP Increased creatinine: Labs. High cholesterol, on Lipitor, well controlled. Anxiety: On Tranxene, UDS and contract today, she smokes marijuana recreationally. Osteoporosis: Recently, a  65 year old dental implant had to be removed due to infection.  She does not need any new implant or major dental surgery at this point.  She is concerned about taking Prolia, his dentist could not link Prolia as the culprit, she is concerned, we had a long conversation about the issue, I offer a Endo appointment for further advised regards osteoporosis treatment , she declined it.  We agreed to continue Prolia and if she decides to stop it she will let me know. Tobacco: Not ready to quit Preventive care: Has not changed her mind about taking a COVID or influenza shot. RTC 6 months CPX

## 2021-09-29 LAB — DRUG MONITORING PANEL 375977 , URINE
Alcohol Metabolites: POSITIVE ng/mL — AB (ref <500)
Alphahydroxyalprazolam: NEGATIVE ng/mL (ref <25)
Alphahydroxymidazolam: NEGATIVE ng/mL (ref <50)
Alphahydroxytriazolam: NEGATIVE ng/mL (ref <50)
Aminoclonazepam: NEGATIVE ng/mL (ref <25)
Amphetamines: NEGATIVE ng/mL (ref <500)
Barbiturates: NEGATIVE ng/mL (ref <300)
Benzodiazepines: POSITIVE ng/mL — AB (ref <100)
Cocaine Metabolite: NEGATIVE ng/mL (ref <150)
Desmethyltramadol: NEGATIVE ng/mL (ref <100)
Ethyl Glucuronide (ETG): >10000 ng/mL — ABNORMAL HIGH (ref <500)
Ethyl Sulfate (ETS): 8125 ng/mL — ABNORMAL HIGH (ref <100)
Hydroxyethylflurazepam: NEGATIVE ng/mL (ref <50)
Lorazepam: NEGATIVE ng/mL (ref <50)
Marijuana Metabolite: 110 ng/mL — ABNORMAL HIGH (ref <5)
Marijuana Metabolite: POSITIVE ng/mL — AB (ref <20)
Nordiazepam: 611 ng/mL — ABNORMAL HIGH (ref <50)
Opiates: NEGATIVE ng/mL (ref <100)
Oxazepam: 5741 ng/mL — ABNORMAL HIGH (ref <50)
Oxycodone: NEGATIVE ng/mL (ref <100)
Temazepam: NEGATIVE ng/mL (ref <50)
Tramadol: NEGATIVE ng/mL (ref <100)

## 2021-09-29 LAB — DM TEMPLATE

## 2021-10-01 IMAGING — DX DG ELBOW 2V*L*
1 series · 1 of 1 positions shown · non-contrast
Comparison: Film from earlier in the same day.

CLINICAL DATA: Status post reduction

EXAM:
LEFT ELBOW - 1 VIEW

[elbow lat]
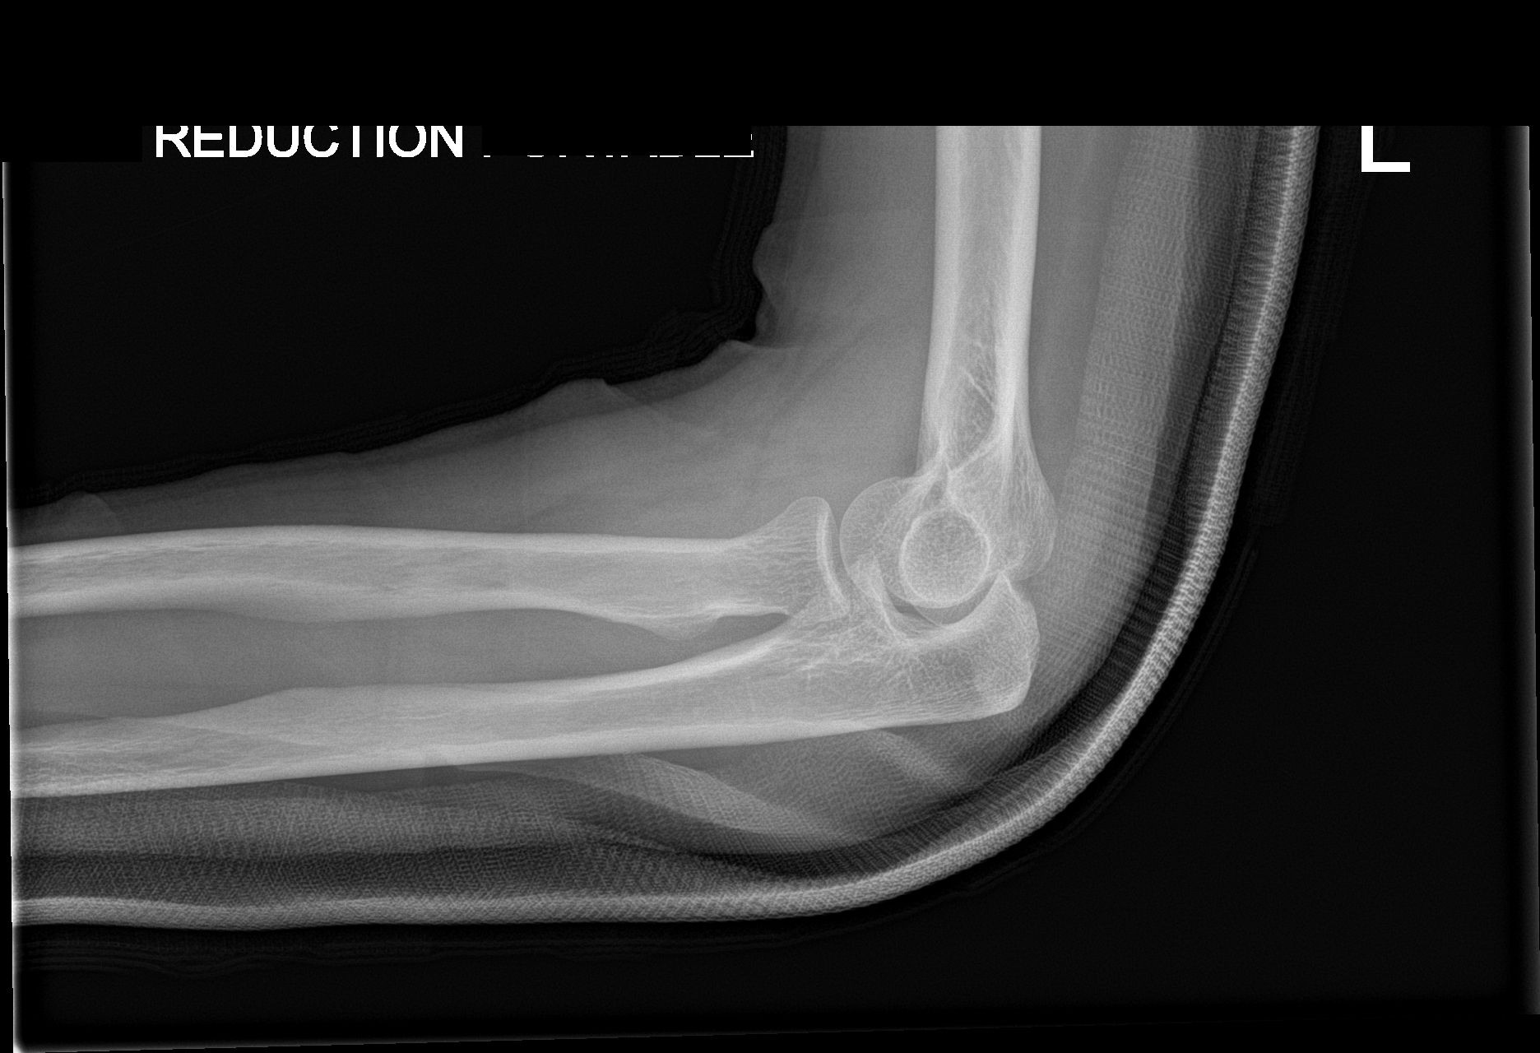

[1 of 1 positions shown; findings below may reference images not displayed]

FINDINGS: There is been interval reduction of the elbow dislocation seen
previously. No definitive fracture is noted. Splinting material is
noted in place.
IMPRESSION: Interval reduction of elbow dislocation.

## 2021-10-10 ENCOUNTER — Other Ambulatory Visit: Payer: Self-pay | Admitting: Internal Medicine

## 2021-10-18 NOTE — Progress Notes (Signed)
Please call patient and let them  know their  low dose Ct was read as a Lung RADS 2: nodules that are benign in appearance and behavior with a very low likelihood of becoming a clinically active cancer due to size or lack of growth. Recommendation per radiology is for a repeat LDCT in 12 months. .Please let them  know we will order and schedule their  annual screening scan for 08/2022. Please let them  know there was notation of CAD on their  scan.  Please remind the patient  that this is a non-gated exam therefore degree or severity of disease  cannot be determined. Please have them  follow up with their PCP regarding potential risk factor modification, dietary therapy or pharmacologic therapy if clinically indicated. Pt.  is  currently on statin therapy. Please place order for annual  screening scan for  08/2022 and fax results to PCP. Thanks so much.  Please let patient know there was notation of mild thickening of the mid esophageal wall. Have her follow up with PCP regarding need for any additional diagnostics. ( Esophogram). Place order for annual low dose CT Chest for 08/2022, and fax results to PCP. Thanks so much

## 2021-10-19 ENCOUNTER — Other Ambulatory Visit: Payer: Self-pay

## 2021-10-19 ENCOUNTER — Telehealth: Payer: Self-pay | Admitting: Acute Care

## 2021-10-19 DIAGNOSIS — F17210 Nicotine dependence, cigarettes, uncomplicated: Secondary | ICD-10-CM

## 2021-10-19 DIAGNOSIS — Z87891 Personal history of nicotine dependence: Secondary | ICD-10-CM

## 2021-10-19 NOTE — Telephone Encounter (Signed)
Contacted patient by phone. Results of LDCT given per Eric Form, NP recommendations.  Patient verbalized understanding and nothing further needed.  Will also send letter to patient in mail regarding scan results.  Patient will discuss results with PCP also regarding esophageal notation.

## 2021-12-06 ENCOUNTER — Telehealth: Payer: Self-pay | Admitting: Internal Medicine

## 2021-12-06 DIAGNOSIS — K2289 Other specified disease of esophagus: Secondary | ICD-10-CM

## 2021-12-06 DIAGNOSIS — R9389 Abnormal findings on diagnostic imaging of other specified body structures: Secondary | ICD-10-CM

## 2021-12-06 NOTE — Telephone Encounter (Signed)
Order placed. Spoke w/ Pt- made her aware. Pt verbalized understanding.

## 2021-12-06 NOTE — Telephone Encounter (Signed)
CT chest lung cancer screening October 2022: Possible esophageal wall thickening. (Patient has not contacted me about the results yet). Call patient: Recommend to proceed with a esophagogram, DX abnormal CT chest.

## 2022-02-28 ENCOUNTER — Telehealth: Payer: Self-pay | Admitting: Internal Medicine

## 2022-02-28 NOTE — Telephone Encounter (Signed)
PDMP okay, Rx sent 

## 2022-02-28 NOTE — Telephone Encounter (Signed)
Requesting: clorazepate 7.'5mg'$   ?Contract:09/25/21 ?UDS:09/25/21 ?Last Visit: 09/25/2021 ?Next Visit: 03/26/22 ?Last Refill: 08/28/2021 #270 and 0RF ? ?Please Advise ? ?

## 2022-03-04 ENCOUNTER — Telehealth: Payer: Self-pay

## 2022-03-04 NOTE — Telephone Encounter (Signed)
Last Prolia inj 04/04/21 ?Next Prolia inj due 10/06/21 ?

## 2022-03-10 NOTE — Telephone Encounter (Signed)
Prolia VOB initiated via [email or web address] ? ?Last Prolia inj 04/04/21 ?Next Prolia inj due 10/06/21 ?

## 2022-03-26 ENCOUNTER — Ambulatory Visit (INDEPENDENT_AMBULATORY_CARE_PROVIDER_SITE_OTHER): Payer: Medicare Other | Admitting: Internal Medicine

## 2022-03-26 ENCOUNTER — Encounter: Payer: Self-pay | Admitting: Internal Medicine

## 2022-03-26 VITALS — BP 118/82 | HR 62 | Temp 98.0°F | Resp 16 | Ht 63.0 in | Wt 125.2 lb

## 2022-03-26 DIAGNOSIS — Z0001 Encounter for general adult medical examination with abnormal findings: Secondary | ICD-10-CM | POA: Diagnosis not present

## 2022-03-26 DIAGNOSIS — R933 Abnormal findings on diagnostic imaging of other parts of digestive tract: Secondary | ICD-10-CM | POA: Diagnosis not present

## 2022-03-26 DIAGNOSIS — N189 Chronic kidney disease, unspecified: Secondary | ICD-10-CM | POA: Diagnosis not present

## 2022-03-26 DIAGNOSIS — E785 Hyperlipidemia, unspecified: Secondary | ICD-10-CM | POA: Diagnosis not present

## 2022-03-26 DIAGNOSIS — I10 Essential (primary) hypertension: Secondary | ICD-10-CM | POA: Diagnosis not present

## 2022-03-26 DIAGNOSIS — D649 Anemia, unspecified: Secondary | ICD-10-CM

## 2022-03-26 DIAGNOSIS — F419 Anxiety disorder, unspecified: Secondary | ICD-10-CM

## 2022-03-26 DIAGNOSIS — Z0000 Encounter for general adult medical examination without abnormal findings: Secondary | ICD-10-CM

## 2022-03-26 LAB — LIPID PANEL
Cholesterol: 155 mg/dL (ref 0–200)
HDL: 64.4 mg/dL (ref >39.00)
LDL Cholesterol: 61 mg/dL (ref 0–99)
NonHDL: 90.47
Total CHOL/HDL Ratio: 2
Triglycerides: 145 mg/dL (ref 0.0–149.0)
VLDL: 29 mg/dL (ref 0.0–40.0)

## 2022-03-26 LAB — COMPREHENSIVE METABOLIC PANEL
ALT: 24 U/L (ref 0–35)
AST: 25 U/L (ref 0–37)
Albumin: 4.7 g/dL (ref 3.5–5.2)
Alkaline Phosphatase: 71 U/L (ref 39–117)
BUN: 35 mg/dL — ABNORMAL HIGH (ref 6–23)
CO2: 26 mEq/L (ref 19–32)
Calcium: 10.1 mg/dL (ref 8.4–10.5)
Chloride: 103 mEq/L (ref 96–112)
Creatinine, Ser: 1.85 mg/dL — ABNORMAL HIGH (ref 0.40–1.20)
GFR: 28.23 mL/min — ABNORMAL LOW (ref >60.00)
Glucose, Bld: 96 mg/dL (ref 70–99)
Potassium: 4.9 mEq/L (ref 3.5–5.1)
Sodium: 141 mEq/L (ref 135–145)
Total Bilirubin: 0.5 mg/dL (ref 0.2–1.2)
Total Protein: 6.8 g/dL (ref 6.0–8.3)

## 2022-03-26 LAB — CBC WITH DIFFERENTIAL/PLATELET
Basophils Absolute: 0.1 10*3/uL (ref 0.0–0.1)
Basophils Relative: 0.8 % (ref 0.0–3.0)
Eosinophils Absolute: 0.2 10*3/uL (ref 0.0–0.7)
Eosinophils Relative: 3.6 % (ref 0.0–5.0)
HCT: 32.1 % — ABNORMAL LOW (ref 36.0–46.0)
Hemoglobin: 11.1 g/dL — ABNORMAL LOW (ref 12.0–15.0)
Lymphocytes Relative: 28.4 % (ref 12.0–46.0)
Lymphs Abs: 2 10*3/uL (ref 0.7–4.0)
MCHC: 34.4 g/dL (ref 30.0–36.0)
MCV: 100.5 fl — ABNORMAL HIGH (ref 78.0–100.0)
Monocytes Absolute: 0.4 10*3/uL (ref 0.1–1.0)
Monocytes Relative: 6.5 % (ref 3.0–12.0)
Neutro Abs: 4.2 10*3/uL (ref 1.4–7.7)
Neutrophils Relative %: 60.7 % (ref 43.0–77.0)
Platelets: 273 10*3/uL (ref 150.0–400.0)
RBC: 3.2 Mil/uL — ABNORMAL LOW (ref 3.87–5.11)
RDW: 13.6 % (ref 11.5–15.5)
WBC: 6.9 10*3/uL (ref 4.0–10.5)

## 2022-03-26 LAB — FERRITIN: Ferritin: 36.7 ng/mL (ref 10.0–291.0)

## 2022-03-26 LAB — IRON: Iron: 87 ug/dL (ref 42–145)

## 2022-03-26 NOTE — Assessment & Plan Note (Signed)
Here for CPX ?HTN: On Zestoretic, BP is very good, check CMP. ?Dyslipidemia: On Lipitor, check FLP. ?Anxiety: On Tranxene, takes 1 or 2 tablets a day.  Well-controlled. ?History of anemia: Last hemoglobin is slightly low, check CBC, iron, ferritin.  No GI symptoms. ?Abnormal CT esophagus: On routine lung cancer screening CT esophagus wall was thick, I rx a barium esophagogram, patient did not pursue.  She is currently asymptomatic.  Advised on the only way to be sure this is not something serious like cancer is further evaluation, she is quite skeptical.  We talk about esophagram, EGD or wait until next CT.  She elected to wait until next lung cancer screening CT ?Osteoporosis: My staff is working on getting Prolia approved by insurance. ?Coronary calcifications: On aspirin and atorvastatin ?RTC 6 months ?

## 2022-03-26 NOTE — Assessment & Plan Note (Signed)
-   Tdap 2022 ?- PNM 20 : Declined ? - s/p shingrix x 2  ?- covid vax: see pervious entries  ?-CCS: Cscope 04-2012--->  Polyps, Cscope 05/26/2018, next 10 years per colonoscopy report ?-Female care  ?Last MMG 08-2021 ?H/o abnormal PAP x 1 in the 90s, f/u by normal ones, last PAP 01/2019, declines further exams, no sxs  ?-Lung Ca screening: - 08-2021, next 1 year ?-Diet and exercise discussed ?-Tobacco abuse:   Still smoking, not ready to quit, info provided  ?-Labs: CMP, FLP, CBC, iron, ferritin ?-Advance directives discussed ?

## 2022-03-26 NOTE — Progress Notes (Signed)
? ?Subjective:  ? ? Patient ID: Kimberly Valenzuela, female    DOB: [birth year], 66 y.o.   MRN: [id number] ? ?DOS:  03/26/2022 ?Type of visit - description: CPX ? ?Here for CPX ?Had a lung cancer screening, esophageal wall was slightly thickened. ?At this time she denies heartburn, odynophagia, weight loss. ?Dysphagia?  Only when she was having dental problems and could not chew her food okay. ?No nausea vomiting.  No blood in the stools. ? ?Review of Systems ? ?Other than above, a 14 point review of systems is negative  ? ?  ? ?Past Medical History:  ?Diagnosis Date  ? Anxiety 08/03/2011  ? Back pain 2015  ? MRI, local injection, better after injection  ? Dyslipidemia   ? high TG , dx ~ 01-2011  ? Hypertension   ? Menopause   ? after BCP were d/c ~ 2010  ? Osteoporosis   ? ? ?Past Surgical History:  ?Procedure Laterality Date  ? CESAREAN SECTION    ? COLONOSCOPY    ? FACELIFT  11/2018  ? POLYPECTOMY    ? ?Social History  ? ?Socioeconomic History  ? Marital status: Widowed  ?  Spouse name: Not on file  ? Number of children: 1  ? Years of education: Not on file  ? Highest education level: Not on file  ?Occupational History  ? Occupation: retired/ Press photographer   ?Tobacco Use  ? Smoking status: Every Day  ?  Packs/day: 1.00  ?  Years: 40.00  ?  Pack years: 40.00  ?  Types: Cigarettes  ? Smokeless tobacco: Never  ? Tobacco comments:  ?  ~ 1/2 ppd  ?Vaping Use  ? Vaping Use: Never used  ?Substance and Sexual Activity  ? Alcohol use: Yes  ?  Comment: socially   ? Drug use: Yes  ?  Frequency: 4.0 times per week  ?  Types: Marijuana  ?  Comment: last smoked yesterday marijuana  ? Sexual activity: Not Currently  ?  Birth control/protection: Post-menopausal  ?Other Topics Concern  ? Not on file  ?Social History Narrative  ? Lost parents 2014  ? Lost her husband ~ 01-2018  ? Lives by herself  ? Sister-brother-son: live in the area   ?    ? ?Social Determinants of Health  ? ?Financial Resource Strain: Not on file  ?Food Insecurity: Not on file   ?Transportation Needs: Not on file  ?Physical Activity: Not on file  ?Stress: Not on file  ?Social Connections: Not on file  ?Intimate Partner Violence: Not on file  ? ? ? ?Current Outpatient Medications  ?Medication Instructions  ? aspirin 81 mg, Oral, Daily, Swallow whole.  ? atorvastatin (LIPITOR) 20 MG tablet TAKE 1 TABLET(20 MG) BY MOUTH AT BEDTIME  ? clorazepate (TRANXENE) 7.5 MG tablet TAKE 1 TABLET BY MOUTH THREE TIMES DAILY AS NEEDED  ? denosumab (PROLIA) 60 mg, Subcutaneous, Every 6 months, Administer in upper arm, thigh, or abdomen   ? lisinopril-hydrochlorothiazide (ZESTORETIC) 10-12.5 MG tablet TAKE 1 TABLET BY MOUTH EVERY DAY  ? Multiple Vitamins-Calcium (ONE-A-DAY WOMENS FORMULA) TABS 1 each, Oral, Daily, Womens Menopause Formula   ? ? ?   ?Objective:  ? Physical Exam ?BP 118/82 (BP Location: Left Arm, Patient Position: Sitting, Cuff Size: Small)   Pulse 62   Temp 98 ?F (36.7 ?C) (Oral)   Resp 16   Ht '5\' 3"'$  (1.6 m)   Wt 125 lb 4 oz (56.8 kg)   SpO2 98%  BMI 22.19 kg/m?  ?General: ?Well developed, NAD, BMI noted ?Neck: No  thyromegaly.  No lymphadenopathies ?HEENT:  ?Normocephalic . Face symmetric, atraumatic ?Lungs:  ?CTA B ?Normal respiratory effort, no intercostal retractions, no accessory muscle use. ?Heart: RRR,  no murmur.  ?Abdomen:  ?Not distended, soft, non-tender. No rebound or rigidity.   ?Lower extremities: no pretibial edema bilaterally  ?Skin: Exposed areas without rash. Not pale. Not jaundice ?Neurologic:  ?alert & oriented X3.  ?Speech normal, gait appropriate for age and unassisted ?Strength symmetric and appropriate for age.  ?Psych: ?Cognition and judgment appear intact.  ?Cooperative with normal attention span and concentration.  ?Behavior appropriate. ?No anxious or depressed appearing. ? ?   ?Assessment   ? ?Assessment ?HTN ?Increase creatinine: Baseline 1.2 >>> 1.59 (06-2015), wnl renal US (09-2015) ?Dyslipidemia ?Anxiety - tranxene  ?Back pain, 2015, had a MRI, improve  after a local injection ?H/o iron deficiency anemia  ?Osteoporosis  ?--T score -2.8 (2013), unable to rx reclast --> started Boniva 2014 ?--T score -3.4 (03-2016)  worse d/t  poor compliance versus no response to [place],  PROLIA #3 07-03-17 ?Menopause after BCP d/c  2010 ?Lipoma R upper back ?+FH ovarian cancer, GM and mother. Had genetic testing  2014 ---> negative ?Coronary calcifications per CT >>> stress test-2022: Prior infarct, low risk.  Plan is CV RF control ?COPD-emphysema: Per CT ? ? ?PLAN:  ?Here for CPX ?HTN: On Zestoretic, BP is very good, check CMP. ?Dyslipidemia: On Lipitor, check FLP. ?Anxiety: On Tranxene, takes 1 or 2 tablets a day.  Well-controlled. ?History of anemia: Last hemoglobin is slightly low, check CBC, iron, ferritin.  No GI symptoms. ?Abnormal CT esophagus: On routine lung cancer screening CT esophagus wall was thick, I rx a barium esophagogram, patient did not pursue.  She is currently asymptomatic.  Advised on the only way to be sure this is not something serious like cancer is further evaluation, she is quite skeptical.  We talk about esophagram, EGD or wait until next CT.  She elected to wait until next lung cancer screening CT ?Osteoporosis: My staff is working on getting Prolia approved by insurance. ?Coronary calcifications: On aspirin and atorvastatin ?RTC 6 months ? ?In addition to CPX, I addressed her chronic medical problems also abnormal CT of the esophagus. ? ? ?

## 2022-03-26 NOTE — Patient Instructions (Signed)
Check the  blood pressure regularly ?BP GOAL is between 110/65 and  135/85. ?If it is consistently higher or lower, let me know ? ?  ? ? ?GO TO THE LAB : Get the blood work   ? ? ?[place], [place] ?Come back for a checkup in 6 months ? ?"Living will", "Health Care Power of attorney": Advanced care planning ? ?(If you already have a living will or healthcare power of attorney, please bring the copy to be scanned in your chart.) ? ?Advance care planning is a process that supports adults in  understanding and sharing their preferences regarding future medical care.  ? ?The patient's preferences are recorded in documents called Advance Directives.    ?Advanced directives are completed (and can be modified at any time) while the patient is in full mental capacity.  ? ?The documentation should be available at all times to the patient, the family and the healthcare providers.  ?Bring in a copy to be scanned in your chart is an excellent idea and is recommended  ? ?This legal documents direct treatment decision making and/or appoint a surrogate to make the decision if the patient is not capable to do so.  ? ? ?Advance directives can be documented in many types of formats,  documents have names such as:  ?Lliving will  ?Durable power of attorney for healthcare (healthcare proxy or healthcare power of attorney)  ?Combined directives  ?Physician orders for life-sustaining treatment  ?  ?More information at: ? [email or web address]  ? ? ? ?Steps to Quit Smoking ?Smoking tobacco is the leading cause of preventable death. It can affect almost every organ in the body. Smoking puts you and those around you at risk for developing many serious chronic diseases. ?Quitting smoking can be very challenging. Do not get discouraged if you are not successful the first time. Some people need to make many attempts to quit before they achieve long-term success. Do your best to stick to your  quit plan, and talk with your health care provider if you have any questions or concerns. ?How do I get ready to quit? ?When you decide to quit smoking, create a plan to help you succeed. Before you quit: ?Pick a date to quit. Set a date within the next 2 weeks to give you time to prepare. ?Write down the reasons why you are quitting. Keep this list in places where you will see it often. ?Tell your family, friends, and co-workers that you are quitting. Support from people you are close to can make quitting easier. ?Talk with your health care provider about your options for quitting smoking. ?Find out what treatment options are covered by your health insurance. ?Identify people, places, things, and activities that make you want to smoke (triggers). Avoid them. ?What first steps can I take to quit smoking? ?Throw away all cigarettes at home, at work, and in your car. ?Throw away smoking accessories, such as Scientist, research (medical). ?Clean your car. Make sure to empty the ashtray. ?Clean your home, including curtains and carpets. ?What strategies can I use to quit smoking? ?Talk with your health care provider about combining strategies, such as taking medicines while you are also receiving in-person counseling. Using these two strategies together makes you more likely to succeed in quitting than if you used either strategy on its own. ?If you are pregnant or breastfeeding, talk with your health care provider about finding counseling or other support strategies to quit smoking. Do  not take medicine to help you quit smoking unless your health care provider tells you to. ?Quit right away ?Quit smoking completely, instead of gradually reducing how much you smoke over a period of time. Stopping smoking right away may be more successful than gradually quitting. ?Attend in-person counseling to help you build problem-solving skills. You are more likely to succeed in quitting if you attend counseling sessions regularly. Even  short sessions of 10 minutes can be effective. ?Take medicine ?You may take medicines to help you quit smoking. Some medicines require a prescription. You can also purchase over-the-counter medicines. Medicines may have nicotine in them to replace the nicotine in cigarettes. Medicines may: ?Help to stop cravings. ?Help to relieve withdrawal symptoms. ?Your health care provider may recommend: ?Nicotine patches, gum, or lozenges. ?Nicotine inhalers or sprays. ?Non-nicotine medicine that you take by mouth. ?Find resources ?Find resources and support systems that can help you quit smoking and remain smoke-free after you quit. These resources are most helpful when you use them often. They include: ?Online chats with a Social worker. ?Telephone quitlines. ?Careers information officer. ?Support groups or group counseling. ?Text messaging programs. ?Mobile phone apps or applications. Use apps that can help you stick to your quit plan by providing reminders, tips, and encouragement. Examples of free services include Quit Guide from the CDC and smokefree.gov ? ?What can I do to make it easier to quit? ? ?Reach out to your family and friends for support and encouragement. Call telephone quitlines, such as 1-800-QUIT-NOW, reach out to support groups, or work with a counselor for support. ?Ask people who smoke to avoid smoking around you. ?Avoid places that trigger you to smoke, such as bars, parties, or smoke-break areas at work. ?Spend time with people who do not smoke. ?Lessen the stress in your life. Stress can be a smoking trigger for some people. To lessen stress, try: ?Exercising regularly. ?Doing deep-breathing exercises. ?Doing yoga. ?Meditating. ?What benefits will I see if I quit smoking? ?Over time, you should start to see positive results, such as: ?Improved sense of smell and taste. ?Decreased coughing and sore throat. ?Slower heart rate. ?Lower blood pressure. ?Clearer and healthier skin. ?The ability to breathe more  easily. ?Fewer sick days. ?Summary ?Quitting smoking can be very challenging. Do not get discouraged if you are not successful the first time. Some people need to make many attempts to quit before they achieve long-term success. ?When you decide to quit smoking, create a plan to help you succeed. ?Quit smoking right away, not slowly over a period of time. ?Find resources and support systems that can help you quit smoking and remain smoke-free after you quit. ?This information is not intended to replace advice given to you by your health care provider. Make sure you discuss any questions you have with your health care provider. ?Document Revised: 10/27/2021 Document Reviewed: 10/27/2021 ?Elsevier Patient Education ? [place]. ? ?

## 2022-03-27 NOTE — Telephone Encounter (Signed)
Prior auth required for PROLIA  PA PROCESS DETAILS: Please complete the prior authorization form located at [email or web address] or call [phone]  

## 2022-03-28 NOTE — Addendum Note (Signed)
Addended byDamita Dunnings D on: 03/28/2022 10:28 AM ? ? Modules accepted: Orders ? ?

## 2022-03-29 ENCOUNTER — Other Ambulatory Visit: Payer: Self-pay | Admitting: Internal Medicine

## 2022-04-05 NOTE — Telephone Encounter (Signed)
Prior Authorization initiated for [facility] via [facility] Provider portal.  Case ID: [id number]

## 2022-04-09 ENCOUNTER — Ambulatory Visit (HOSPITAL_BASED_OUTPATIENT_CLINIC_OR_DEPARTMENT_OTHER)
Admission: RE | Admit: 2022-04-09 | Discharge: 2022-04-09 | Disposition: A | Discharge status: Home / Self Care | Payer: Medicare Other | Source: Ambulatory Visit | Attending: Internal Medicine | Admitting: Internal Medicine

## 2022-04-09 DIAGNOSIS — N281 Cyst of kidney, acquired: Secondary | ICD-10-CM | POA: Diagnosis not present

## 2022-04-09 DIAGNOSIS — N189 Chronic kidney disease, unspecified: Secondary | ICD-10-CM | POA: Insufficient documentation

## 2022-04-11 ENCOUNTER — Telehealth: Payer: Self-pay

## 2022-04-11 NOTE — Telephone Encounter (Signed)
Fyi.

## 2022-04-11 NOTE — Telephone Encounter (Signed)
-----   Message from [place], [place] sent at 04/11/2022 11:46 AM EDT ----- Regarding: Prolia Pt unable to have Prolia at this time d/t Kidney function. Will need to wait until after she see's nephrology.

## 2022-04-12 NOTE — Telephone Encounter (Signed)
Damita Dunnings, CMA  Jasper Loser, CMA; Creft, Darlis Loan, CMA; Damita Dunnings, CMA Pt unable to have Prolia at this time d/t Kidney function. Will need to wait until after she see's nephrology.

## 2022-04-21 ENCOUNTER — Other Ambulatory Visit: Payer: Self-pay | Admitting: Internal Medicine

## 2022-05-15 ENCOUNTER — Telehealth: Payer: Self-pay

## 2022-05-15 NOTE — Telephone Encounter (Signed)
I reached out to [facility] to check on status of referral from 03/28/22 because Pt is overdue for Prolia. I received a call from [place] at [facility] that they did try to schedule an appt w/ her but she was very rude and told them she didn't need to see them. They will keep her referral open for 2 more weeks but then close it.

## 2022-05-17 NOTE — Telephone Encounter (Signed)
Will check w/ Brandy to make sure everything is good to go insurance wise first for Prolia. Nephrology referral cancelled.

## 2022-05-17 NOTE — Telephone Encounter (Signed)
Per Dr. Larose Kells- it is okay for Pt to proceed with Prolia now- is she okay to be scheduled?

## 2022-05-17 NOTE — Addendum Note (Signed)
Addended byDamita Dunnings D on: 05/17/2022 09:11 AM   Modules accepted: Orders

## 2022-05-17 NOTE — Telephone Encounter (Signed)
Okay to proceed with Prolia when needed.   Okay to cancel nephrology referral ===== I spoke with the patient regards to kidney function, she felt that because the renal US was normal she did not need to see nephrology.  I explained her that is not the case. She still declines to see nephrology against my advice. She would like her kidney function rechecked at the next opportunity.

## 2022-06-03 NOTE — Telephone Encounter (Signed)
Pt ready for scheduling on or after 04/05/22  Out-of-pocket cost due at time of visit: $296  Primary: [place] - Medicare Adv Prolia co-insurance: 20% (approximately $276) Admin fee co-insurance: $20  Secondary: n/a Prolia co-insurance:  Admin fee co-insurance:   Deductible: does not apply  Prior Auth: APPROVED PA# [id number] Valid: 04/05/22-04/06/23   ** This summary of benefits is an estimation of the patient's out-of-pocket cost. Exact cost may vary based on individual plan coverage.

## 2022-06-03 NOTE — Telephone Encounter (Signed)
PA# [id number] Valid: 04/05/22-04/06/23

## 2022-06-05 NOTE — Telephone Encounter (Signed)
Letter mailed to Pt w/ cost breakdown. Informed if cost is feasible to please call office to schedule nurse visit.

## 2022-08-24 ENCOUNTER — Other Ambulatory Visit: Payer: Self-pay | Admitting: Internal Medicine

## 2022-08-24 NOTE — Telephone Encounter (Signed)
Requesting: clorazepate 7.'5mg'$  Contract: 09/25/21 UDS: 09/25/21 Last Visit: 03/26/22 Next Visit: 09/26/22 Last Refill: 02/28/22 #270 and 0RF   Please Advise

## 2022-08-28 ENCOUNTER — Ambulatory Visit (HOSPITAL_BASED_OUTPATIENT_CLINIC_OR_DEPARTMENT_OTHER)
Admission: RE | Admit: 2022-08-28 | Discharge: 2022-08-28 | Disposition: A | Discharge status: Home / Self Care | Payer: Medicare Other | Source: Ambulatory Visit | Attending: Acute Care | Admitting: Acute Care

## 2022-08-28 DIAGNOSIS — F17210 Nicotine dependence, cigarettes, uncomplicated: Secondary | ICD-10-CM | POA: Insufficient documentation

## 2022-08-28 DIAGNOSIS — I700 Atherosclerosis of aorta: Secondary | ICD-10-CM | POA: Diagnosis not present

## 2022-08-28 DIAGNOSIS — I2510 Atherosclerotic heart disease of native coronary artery without angina pectoris: Secondary | ICD-10-CM | POA: Insufficient documentation

## 2022-08-28 DIAGNOSIS — J439 Emphysema, unspecified: Secondary | ICD-10-CM | POA: Diagnosis not present

## 2022-08-28 DIAGNOSIS — Z87891 Personal history of nicotine dependence: Secondary | ICD-10-CM

## 2022-08-28 DIAGNOSIS — Z122 Encounter for screening for malignant neoplasm of respiratory organs: Secondary | ICD-10-CM | POA: Diagnosis not present

## 2022-08-30 ENCOUNTER — Other Ambulatory Visit: Payer: Self-pay

## 2022-08-30 DIAGNOSIS — Z87891 Personal history of nicotine dependence: Secondary | ICD-10-CM

## 2022-08-30 DIAGNOSIS — F17210 Nicotine dependence, cigarettes, uncomplicated: Secondary | ICD-10-CM

## 2022-08-30 DIAGNOSIS — Z122 Encounter for screening for malignant neoplasm of respiratory organs: Secondary | ICD-10-CM

## 2022-09-18 NOTE — Telephone Encounter (Signed)
No insurance changes since last benefits check. PA still valid.

## 2022-09-26 ENCOUNTER — Encounter: Payer: Self-pay | Admitting: Internal Medicine

## 2022-09-26 ENCOUNTER — Ambulatory Visit (INDEPENDENT_AMBULATORY_CARE_PROVIDER_SITE_OTHER): Payer: Medicare Other | Admitting: Internal Medicine

## 2022-09-26 VITALS — BP 126/68 | HR 75 | Temp 98.3°F | Resp 16 | Ht 63.0 in | Wt 125.5 lb

## 2022-09-26 DIAGNOSIS — Z79899 Other long term (current) drug therapy: Secondary | ICD-10-CM

## 2022-09-26 DIAGNOSIS — F419 Anxiety disorder, unspecified: Secondary | ICD-10-CM

## 2022-09-26 DIAGNOSIS — M810 Age-related osteoporosis without current pathological fracture: Secondary | ICD-10-CM | POA: Diagnosis not present

## 2022-09-26 DIAGNOSIS — N189 Chronic kidney disease, unspecified: Secondary | ICD-10-CM

## 2022-09-26 DIAGNOSIS — E785 Hyperlipidemia, unspecified: Secondary | ICD-10-CM

## 2022-09-26 DIAGNOSIS — I10 Essential (primary) hypertension: Secondary | ICD-10-CM

## 2022-09-26 LAB — CBC WITH DIFFERENTIAL/PLATELET
Basophils Absolute: 0 10*3/uL (ref 0.0–0.1)
Basophils Relative: 0.4 % (ref 0.0–3.0)
Eosinophils Absolute: 0.2 10*3/uL (ref 0.0–0.7)
Eosinophils Relative: 3.2 % (ref 0.0–5.0)
HCT: 32 % — ABNORMAL LOW (ref 36.0–46.0)
Hemoglobin: 11 g/dL — ABNORMAL LOW (ref 12.0–15.0)
Lymphocytes Relative: 31.5 % (ref 12.0–46.0)
Lymphs Abs: 2.2 10*3/uL (ref 0.7–4.0)
MCHC: 34.4 g/dL (ref 30.0–36.0)
MCV: 97.4 fl (ref 78.0–100.0)
Monocytes Absolute: 0.4 10*3/uL (ref 0.1–1.0)
Monocytes Relative: 5.2 % (ref 3.0–12.0)
Neutro Abs: 4.3 10*3/uL (ref 1.4–7.7)
Neutrophils Relative %: 59.7 % (ref 43.0–77.0)
Platelets: 253 10*3/uL (ref 150.0–400.0)
RBC: 3.29 Mil/uL — ABNORMAL LOW (ref 3.87–5.11)
RDW: 12.8 % (ref 11.5–15.5)
WBC: 7.1 10*3/uL (ref 4.0–10.5)

## 2022-09-26 LAB — BASIC METABOLIC PANEL
BUN: 22 mg/dL (ref 6–23)
CO2: 26 mEq/L (ref 19–32)
Calcium: 10.5 mg/dL (ref 8.4–10.5)
Chloride: 104 mEq/L (ref 96–112)
Creatinine, Ser: 1.43 mg/dL — ABNORMAL HIGH (ref 0.40–1.20)
GFR: 38.31 mL/min — ABNORMAL LOW (ref >60.00)
Glucose, Bld: 87 mg/dL (ref 70–99)
Potassium: 4.3 mEq/L (ref 3.5–5.1)
Sodium: 139 mEq/L (ref 135–145)

## 2022-09-26 MED ORDER — DENOSUMAB 60 MG/ML ~~LOC~~ SOSY: 60.0000 mg | PREFILLED_SYRINGE | Freq: Once | SUBCUTANEOUS | Status: DC

## 2022-09-26 NOTE — Progress Notes (Signed)
Subjective:    Patient ID: Kimberly Valenzuela, female    DOB: 05/02/1956, 66 y.o.   MRN: [id number]  DOS:  09/26/2022 Type of visit - description: Routine checkup  Since the last office visit is feeling well. Good medication compliance, ambulatory BPs typically okay. Still smoking.  Review of Systems See above   Past Medical History:  Diagnosis Date   Anxiety 08/03/2011   Back pain 2015   MRI, local injection, better after injection   Dyslipidemia    high TG , dx ~ 01-2011   Hypertension    Menopause    after BCP were d/c ~ 2010   Osteoporosis     Past Surgical History:  Procedure Laterality Date   CESAREAN SECTION     COLONOSCOPY     FACELIFT  11/2018   POLYPECTOMY      Current Outpatient Medications  Medication Instructions   aspirin EC 81 mg, Oral, Daily, Swallow whole.   atorvastatin (LIPITOR) 20 MG tablet TAKE 1 TABLET(20 MG) BY MOUTH AT BEDTIME   clorazepate (TRANXENE) 7.5 MG tablet TAKE 1 TABLET BY MOUTH THREE TIMES DAILY AS NEEDED   denosumab (PROLIA) 60 mg, Every 6 months   lisinopril-hydrochlorothiazide (ZESTORETIC) 10-12.5 MG tablet TAKE 1 TABLET BY MOUTH EVERY DAY   Multiple Vitamins-Calcium (ONE-A-DAY WOMENS FORMULA) TABS 1 each, Oral, Daily, Womens Menopause Formula        Objective:   Physical Exam BP 126/68   Pulse 75   Temp 98.3 F (36.8 C) (Oral)   Resp 16   Ht '5\' 3"'$  (1.6 m)   Wt 125 lb 8 oz (56.9 kg)   SpO2 98%   BMI 22.23 kg/m  General:   Well developed, NAD, BMI noted. HEENT:  Normocephalic . Face symmetric, atraumatic Lungs:  CTA B Normal respiratory effort, no intercostal retractions, no accessory muscle use. Heart: RRR,  no murmur.  Lower extremities: no pretibial edema bilaterally  Skin: Not pale. Not jaundice Neurologic:  alert & oriented X3.  Speech normal, gait appropriate for age and unassisted Psych--  Cognition and judgment appear intact.  Cooperative with normal attention span and concentration.  Behavior  appropriate. No anxious or depressed appearing.      Assessment    Assessment HTN Increase creatinine: Baseline 1.2 >>> 1.59 (06-2015), wnl renal US (09-2015) Dyslipidemia Anxiety - tranxene  Back pain, 2015, had a MRI, improve after a local injection H/o iron deficiency anemia  Osteoporosis  --T score -2.8 (2013), unable to rx reclast --> started Boniva 2014 --T score -3.4 (03-2016)  worse d/t  poor compliance versus no response to Glenn,  PROLIA #3 07-03-17 Lipoma R upper back +FH ovarian cancer, GM and mother. Had genetic testing  2014 ---> negative Coronary calcifications per CT >>> stress test-2022: Prior infarct, low risk.  Plan is CV RF control COPD-emphysema: Per CT   PLAN:  HTN: Seems well controlled, continue Zestoretic. Anxiety: On Tranxene, needs UDS. Dyslipidemia: On Lipitor, controlled. CKD: Creatinine was trending up,  renal US 03/2022 >> no evidence of obstruction.  The patient declined a renal referral, reports that she was taking ibuprofen, since then she has stopped.  Recheck BMP.  Further advised with results Osteoporosis: Last prolia  09/25/2021, will proceed with another injection pending BMP. Abnormal CT esophagus: On 08-2021, as CT showed esophagus wall was thick, CT 08-2022 did not show that abnormality.  Patient reports no symptoms. Rx Observation Vaccine advice: RSV, COVID-vaccine, PNM 20, flu shot. Pros>>cons, declined RTC cpx 6 m

## 2022-09-26 NOTE — Patient Instructions (Signed)
Check the  blood pressure regularly BP GOAL is between 110/65 and  135/85. If it is consistently higher or lower, let me know      GO TO THE LAB : Get the blood work     GO TO THE FRONT DESK, PLEASE SCHEDULE YOUR APPOINTMENTS Come back for   a physical exam in 6 months 

## 2022-09-26 NOTE — Assessment & Plan Note (Signed)
HTN: Seems well controlled, continue Zestoretic. Anxiety: On Tranxene, needs UDS. Dyslipidemia: On Lipitor, controlled. CKD: Creatinine was trending up,  renal US 03/2022 >> no evidence of obstruction.  The patient declined a renal referral, reports that she was taking ibuprofen, since then she has stopped.  Recheck BMP.  Further advised with results Osteoporosis: Last prolia  09/25/2021, will proceed with another injection pending BMP. Abnormal CT esophagus: On 08-2021, as CT showed esophagus wall was thick, CT 08-2022 did not show that abnormality.  Patient reports no symptoms. Rx Observation Vaccine advice: RSV, COVID-vaccine, PNM 20, flu shot. Pros>>cons, declined RTC cpx 6 m

## 2022-09-29 LAB — DRUG MONITORING PANEL 375977 , URINE
Alcohol Metabolites: POSITIVE ng/mL — AB (ref <500)
Alphahydroxyalprazolam: NEGATIVE ng/mL (ref <25)
Alphahydroxymidazolam: NEGATIVE ng/mL (ref <50)
Alphahydroxytriazolam: NEGATIVE ng/mL (ref <50)
Aminoclonazepam: NEGATIVE ng/mL (ref <25)
Amphetamines: NEGATIVE ng/mL (ref <500)
Barbiturates: NEGATIVE ng/mL (ref <300)
Benzodiazepines: POSITIVE ng/mL — AB (ref <100)
Cocaine Metabolite: NEGATIVE ng/mL (ref <150)
Desmethyltramadol: NEGATIVE ng/mL (ref <100)
Ethyl Glucuronide (ETG): >10000 ng/mL — ABNORMAL HIGH (ref <500)
Ethyl Sulfate (ETS): 9410 ng/mL — ABNORMAL HIGH (ref <100)
Hydroxyethylflurazepam: NEGATIVE ng/mL (ref <50)
Lorazepam: NEGATIVE ng/mL (ref <50)
Marijuana Metabolite: 72 ng/mL — ABNORMAL HIGH (ref <5)
Marijuana Metabolite: POSITIVE ng/mL — AB (ref <20)
Nordiazepam: 619 ng/mL — ABNORMAL HIGH (ref <50)
Opiates: NEGATIVE ng/mL (ref <100)
Oxazepam: 3354 ng/mL — ABNORMAL HIGH (ref <50)
Oxycodone: NEGATIVE ng/mL (ref <100)
Temazepam: NEGATIVE ng/mL (ref <50)
Tramadol: NEGATIVE ng/mL (ref <100)

## 2022-09-29 LAB — DM TEMPLATE

## 2022-09-30 ENCOUNTER — Other Ambulatory Visit: Payer: Self-pay | Admitting: Internal Medicine

## 2022-10-19 ENCOUNTER — Other Ambulatory Visit: Payer: Self-pay | Admitting: Internal Medicine

## 2022-10-25 ENCOUNTER — Telehealth (INDEPENDENT_AMBULATORY_CARE_PROVIDER_SITE_OTHER): Payer: Medicare Other | Admitting: Family Medicine

## 2022-10-25 VITALS — Ht 63.0 in | Wt 125.0 lb

## 2022-10-25 DIAGNOSIS — Z0000 Encounter for general adult medical examination without abnormal findings: Secondary | ICD-10-CM | POA: Diagnosis not present

## 2022-10-25 NOTE — Patient Instructions (Addendum)
I really enjoyed getting to talk with you today! I am available on Tuesdays and Thursdays for virtual visits if you have any questions or concerns, or if I can be of any further assistance.   CHECKLIST FROM ANNUAL WELLNESS VISIT:  -Follow up (please call to schedule if not scheduled after visit):  -Inperson visit with your Primary Doctor office: in 3 months or as schedule if scheduled sooner -yearly for annual wellness visit with primary care office  Here is a list of your preventive care/health maintenance measures and the plan for each if any are due:  Health Maintenance  Topic Date Due   MAMMOGRAM  08/28/2022, please call to schedule   INFLUENZA VACCINE  Per patient preference   Lung Cancer Screening  08/29/2023   Medicare Annual Wellness (AWV)  10/26/2023   COLONOSCOPY (Pts 45-87yr Insurance coverage will need to be confirmed)  05/26/2028   DTaP/Tdap/Td (3 - Td or Tdap) 03/25/2031   DEXA SCAN  Medicare will usually cover to do this every 2 years, Due.    Hepatitis C Screening  Completed   Zoster Vaccines- Shingrix  Completed   HPV VACCINES  Aged Out   Pneumonia Vaccine 66 Years old  Discontinued   COVID-19 Vaccine  Discontinued    -See a dentist at least yearly  -Get your eyes checked and then per your eye specialist's recommendations  -Other issues addressed today:  -If you wish to quit smoking:  1) call the quit line 1-800- QUITNOW  2) set quit date sometime in the next few weeks 3) have plan for when you get cravings (take a walk, play a game on phone, use nicotine gum or lozenge, etc.)  -please consider cutting back to 1 beer daily, per haps replace with water with fresh fruit or with seltzer  -I have included below further information regarding a healthy whole foods based diet, physical activity guidelines for adults, stress management and opportunities for social connections. I hope you find this information useful.      FOOD - THE FUEL FOR A HAPPY HEALTHY LIFE: -eat real food: lots of colorful vegetables (half the plate) and fruits -5-7 servings of vegetables and fruits per day (fresh or steamed is best), exp. 2 servings of vegetables with lunch and dinner and 2 servings of fruit per day. Berries and greens such as kale and collards are great choices.  -consume on a regular basis: whole grains (make sure first ingredient on label contains the word "whole"), fresh fruits, fish, nuts, seeds, healthy oils (such as olive oil, avocado oil, grape seed oil) -may eat small amounts of dairy and lean meat on occasion, but avoid processed meats such as ham, bacon, lunch meat, etc. -drink water -try to avoid fast food and pre-packaged foods, processed meat -most experts advise limiting sodium to < '2300mg'$  per day, should limit further is any chronic conditions such as high blood pressure, heart disease, diabetes, etc. The American Heart Association advised that < '1500mg'$  is is ideal -try to avoid foods that contain any ingredients with names you do not recognize  -try to avoid sugar/sweets (except for the natural sugar that occurs in fresh fruit) -try to avoid sweet drinks -try to avoid white rice, white bread, pasta (unless whole grain), white or yellow potatoes  MOVE - the key to keeping your body moving and working best: -if you wish to increase your physical activity, do so gradually and with the approval of your doctor -STOP and seek medical care immediately if  you have any chest pain, chest discomfort or trouble breathing when starting or increasing exercise  -move and stretch your body, legs, feet and arms when sitting for long periods -Physical activity guidelines  for optimal health in adults: -least 150 minutes per week of aerobic exercise (can talk, but not sing) once approved by your doctor, 20-30 minutes of sustained activity or two 10 minute episodes of sustained activity every day.  -resistance training at least 2 days per week if approved by your doctor -balance exercises 3+ days per week:   Stand somewhere where you have something sturdy to hold onto if you lose balance.    1) lift up on toes, start with 5x per day and work up to 20x   2) stand and lift on leg straight out to the side so that foot is a few inches of the floor, start with 5x each side and work up to 20x each side   3) stand on one foot, start with 5 seconds each side and work up to 20 seconds on each side  If you need ideas or help with getting more active:  -Silver sneakers https://tools.silversneakers.com  -Walk with a Doc: http://stephens-thompson.biz/  -try to include resistance (weight lifting/strength building) and balance exercises twice per week: or the following link for ideas: ChessContest.fr  UpdateClothing.com.cy  STRESS MANAGEMENT - so important for health and well being -try meditating, or just sitting quietly with deep breathing while intentionally relaxing all parts of your body for 5 minutes daily  SOCIAL CONNECTIONS: -options in [place] if you wish to engage in more social and exercise related activities:  -Silver sneakers https://tools.silversneakers.com  -Walk with a Doc: http://stephens-thompson.biz/  -Check out the [place] 50+ section on the [place] of [employer] (hiking clubs, book clubs, cards and games, chess, exercise classes, aquatic classes and much more) - see the website for details: https://www.[place]-.gov/departments/parks-recreation/active-adults50  -YouTube has lots of exercise videos for different ages and abilities as  well  -[place] (a variety of indoor and outdoor inperson activities for adults). [phone]. [address].  -Virtual Online Classes (a variety of topics): see seniorplanet.org or call ([phone]  -consider volunteering at a school, hospice center, church, senior center or elsewhere

## 2022-10-25 NOTE — Progress Notes (Signed)
PATIENT CHECK-IN and HEALTH RISK ASSESSMENT QUESTIONNAIRE:  -completed by phone for upcoming Medicare Preventive Visit  Pre-Visit Check-in: 1)Vitals (height, wt, BP, etc) - record in vitals section for visit on day of visit 2)Review and Update Medications, Allergies PMH, Surgeries, Social history in Epic 3)Hospitalizations in the last year with date/reason?  No   4)Review and Update Care Team (patient's specialists) in Epic 5) Complete PHQ9 in Epic  6) Complete Fall Screening in Epic 7)Review all Health Maintenance Due and order under PCP if not done.  8)Medicare Wellness Questionnaire: Answer theses question about your habits: Do you drink alcohol?  yes If yes, how many drinks do you have a day? 3 beers/day, used to drink liquor and drink more Have you ever smoked?yes  How many packs a day do/did you smoke?  Less than a pack per day, for 40 years, a little motivation to quit, wants to quit because"doesn't like the taste" and "knows they are bad for her", has never tried to quit Do you use smokeless tobacco? No  Do you use an illicit drugs? marijuana Do you exercises? Yes IF so, what type and how many days/minutes per week? walks dogs daily, sit down exercises, still does all of her yard work Are you sexually active?  No Number of partners? n/a Typical breakfast OJ, pop tart, varies  Typical lunch corn dog, sandwich or chicken pot pie  Typical dinner varies  Typical snacks: no   Beverages: diet tea (lipton) beer  Answer theses question about you: Can you perform most household chores? Yes  Do you find it hard to follow a conversation in a noisy room? No  Do you often ask people to speak up or repeat themselves? No   Do you feel that you have a problem with memory? No  Do you balance your checkbook and or bank acounts? Yes  Do you feel safe at home? Yes  Last dentist visit? Not a month ago Do you need assistance with any of the following: Please note if so  no   Driving?  Feeding  yourself?  Getting from bed to chair?  Getting to the toilet?  Bathing or showering?  Dressing yourself?  Managing money?  Climbing a flight of stairs  Preparing meals?  Do you have Advanced Directives in place (Living Will, Healthcare Power or Attorney)? yes    Last eye Exam and location? 1.5 years ago   Do you currently use prescribed or non-prescribed narcotic or opioid pain medications? No   Do you have a history or close family history of breast, ovarian, tubal or peritoneal cancer or a family member with BRCA (breast cancer susceptibility 1 and 2) gene mutations? No   Nurse/Assistant Credentials/time stamp: Anderson Malta 12:31 pm 10/25/22   ----------------------------------------------------------------------------------------------------------------------------------------------------------------------------------------------------------------------   MEDICARE ANNUAL PREVENTIVE VISIT WITH PROVIDER: (Welcome to Medicare, initial annual wellness or annual wellness exam)  Virtual Visit via Video Note  I connected with Kimberly Valenzuela  on 10/26/23 by a video enabled telemedicine application and verified that I am speaking with the correct person using two identifiers.  Location patient: home Location provider:work or home office Persons participating in the virtual visit: patient, provider  Concerns and/or follow up today: none   See HM section in Epic for other details of completed HM.    ROS: negative for report of fevers, unintentional weight loss, vision changes, vision loss, hearing loss or change, chest pain, sob, hemoptysis, melena, hematochezia, hematuria, genital discharge or lesions, falls, bleeding or bruising, loc, thoughts of  suicide or self harm, memory loss  Patient-completed extensive health risk assessment - reviewed and discussed with the patient: See Health Risk Assessment completed with patient prior to the visit either above or in recent phone note. This was  reviewed in detailed with the patient today and appropriate recommendations, orders and referrals were placed as needed per Summary below and patient instructions.   Review of Medical History: -PMH, [place], Family History and current specialty and care providers reviewed and updated and listed below   Patient Care Team: Colon Branch, MD as PCP - General (Internal Medicine) Truett Mainland, DO as Attending Physician (Obstetrics and Gynecology) Gatha Mayer, MD as Consulting Physician (Gastroenterology) [facility]   Past Medical History:  Diagnosis Date   Anxiety 08/03/2011   Back pain 2015   MRI, local injection, better after injection   Dyslipidemia    high TG , dx ~ 01-2011   Hypertension    Menopause    after BCP were d/c ~ 2010   Osteoporosis     Past Surgical History:  Procedure Laterality Date   CESAREAN SECTION     COLONOSCOPY     FACELIFT  11/2018   POLYPECTOMY      Social History   Socioeconomic History   Marital status: Widowed    Spouse name: Not on file   Number of children: 1   Years of education: Not on file   Highest education level: Not on file  Occupational History   Occupation: retired/ Press photographer   Tobacco Use   Smoking status: Every Day    Packs/day: 1.00    Years: 40.00    Total pack years: 40.00    Types: Cigarettes   Smokeless tobacco: Never   Tobacco comments:    ~ 1/2 ppd  Vaping Use   Vaping Use: Never used  Substance and Sexual Activity   Alcohol use: Yes    Comment: socially    Drug use: Yes    Frequency: 4.0 times per week    Types: Marijuana    Comment: last smoked yesterday marijuana   Sexual activity: Not Currently    Birth control/protection: Post-menopausal  Other Topics Concern   Not on file  Social History Narrative   Lost parents 56   Lost her husband ~ 01-2018   Lives by herself   11: live in the area        Social Determinants of Health   Financial Resource Strain: Not on file   Food Insecurity: Not on file  Transportation Needs: Not on file  Physical Activity: Not on file  Stress: Not on file  Social Connections: Not on file  Intimate Partner Violence: Not on file    Family History  Problem Relation Age of Onset   Ovarian cancer Mother 57   Colon cancer Maternal Grandmother 75       dx in her 48s   Renal cancer Maternal Grandfather        dx in his 48s   Lung cancer Maternal Uncle        smoker   Diabetes Neg Hx    Coronary artery disease Neg Hx    Stroke Neg Hx    Breast cancer Neg Hx    Esophageal cancer Neg Hx    Liver cancer Neg Hx    Pancreatic cancer Neg Hx    Rectal cancer Neg Hx    Stomach cancer Neg Hx     Current Outpatient Medications on File  Prior to Visit  Medication Sig Dispense Refill   aspirin 81 MG EC tablet Take 81 mg by mouth daily. Swallow whole.     atorvastatin (LIPITOR) 20 MG tablet TAKE 1 TABLET(20 MG) BY MOUTH AT BEDTIME 90 tablet 2   clorazepate (TRANXENE) 7.5 MG tablet TAKE 1 TABLET BY MOUTH THREE TIMES DAILY AS NEEDED 270 tablet 0   lisinopril-hydrochlorothiazide (ZESTORETIC) 10-12.5 MG tablet Take 1 tablet by mouth daily. 90 tablet 1   Multiple Vitamins-Calcium (ONE-A-DAY WOMENS FORMULA) TABS Take 1 each by mouth daily. Womens Menopause Formula     denosumab (PROLIA) 60 MG/ML SOLN injection Inject 60 mg into the skin every 6 (six) months. Administer in upper arm, thigh, or abdomen (Patient not taking: Reported on 09/26/2022)     No current facility-administered medications on file prior to visit.    No Known Allergies     Physical Exam There were no vitals filed for this visit. Estimated body mass index is 22.14 kg/m as calculated from the following:   Height as of this encounter: 5' 3" (1.6 m).   Weight as of this encounter: 125 lb (56.7 kg).  EKG (optional): deferred due to virtual visit  GENERAL: alert, oriented, appears well, no audible sounds of distress, vision exam deferred due to audio  visit  PSYCH/NEURO: pleasant and cooperative, no obvious depression or anxiety, speech and thought processing grossly intact, Cognitive function grossly intact  Flowsheet Row Office Visit from 01/05/2019 in [facility] [place]  PHQ-9 Total Score 5           10/25/2022   12:22 PM 09/26/2022    9:20 AM 03/26/2022    9:01 AM 09/25/2021    8:30 AM 03/24/2021    8:09 AM  Depression screen PHQ 2/9  Decreased Interest 0 0 0 0 1  Down, Depressed, Hopeless 0 0 0 0 0  PHQ - 2 Score 0 0 0 0 1       03/24/2021    8:08 AM 09/25/2021    8:30 AM 03/26/2022    9:00 AM 09/26/2022    9:20 AM 10/25/2022   12:22 PM  Fall Risk  Falls in the past year? 1 0 0 0 0  Was there an injury with Fall? 1 0 0 0 0  Fall Risk Category Calculator 2 0 0 0 0  Fall Risk Category Moderate Low Low Low Low  Patient Fall Risk Level Moderate fall risk Low fall risk Low fall risk Low fall risk Low fall risk  Patient at Risk for Falls Due to     No Fall Risks  Fall risk Follow up _0      SUMMARY AND PLAN:  Medicare annual wellness visit, subsequent   Discussed applicable health maintenance/preventive health measures and advised and referred or ordered per patient preferences:  Health Maintenance  Topic Date Due   MAMMOGRAM  08/28/2022, she agrees to schedule - reports has number to call.   INFLUENZA VACCINE  02/17/2023 (Originally 06/19/2022), reports will never get this.    Lung Cancer Screening  08/29/2023   Medicare Annual Wellness (AWV)  10/26/2023   COLONOSCOPY (Pts 45-43yr Insurance coverage will need to be confirmed)  05/26/2028   DTaP/Tdap/Td (3 - Td or Tdap) 03/25/2031   DEXA SCAN  Completed   Hepatitis C Screening  Completed   Zoster Vaccines- Shingrix  Completed   HPV VACCINES  Aged  Out   Pneumonia Vaccine 48+ Years old  Discontinued    COVID-19 Vaccine  Discontinued   Advised due for bone density. She has had tx for osteoporosis in the past and is not sure wishes to continue. Advised to consider and discuss with PCP. Advised healthy diet, regular weight bearing exercise, cal, vit D and smoking cessation.  Education and counseling on the following was provided based on the above review of health and a plan/checklist for the patient, along with additional information discussed, was provided for the patient in the patient instructions :    -Advised and counseled on maintaining healthy weight and healthy lifestyle - including the importance of a health diet, regular physical activity, social connections and stress management. -Advised and counseled on a whole foods based healthy diet and regular exercise: discussed a heart healthy whole foods based diet at length. A summary of a healthy diet was provided in the Patient Instructions. - Recommended regular exercise and discussed options within the community.  -Advised yearly dental visits at minimum and regular eye exams -Advised and counseled on alcohol (safe limits, risks, advised to cut back, she agrees to consider and does not feel would have trouble cutting back.) May consider replacing several drinks with water with fruit or seltzer. Counseled on smoking and drug use. She is interested in quitting smoking. Not yet ready to pick quit day. Advised of risks/benefits of quitting, resources for quitting. Provided quit line info. She does not wish to use drugs. She is considering quitting and is thinking of other behaviors to do when has cravings - some ideas we came up with include going for a walk as she is more relxed when outside and likes to walk or playing game on her phone.  Advised to call us for visit if needs further help 1 week before quit day. Supported and Advertising account executive on desire to quit.   Follow up: see patient instructions     Patient Instructions  I really enjoyed  getting to talk with you today! I am available on Tuesdays and Thursdays for virtual visits if you have any questions or concerns, or if I can be of any further assistance.   CHECKLIST FROM ANNUAL WELLNESS VISIT:  -Follow up (please call to schedule if not scheduled after visit):  -Inperson visit with your Primary Doctor office: in 3 months or as schedule if scheduled sooner -yearly for annual wellness visit with primary care office  Here is a list of your preventive care/health maintenance measures and the plan for each if any are due:  Health Maintenance  Topic Date Due   MAMMOGRAM  08/28/2022, please call to schedule   INFLUENZA VACCINE  Per patient preference   Lung Cancer Screening  08/29/2023   Medicare Annual Wellness (AWV)  10/26/2023   COLONOSCOPY (Pts 45-56yr Insurance coverage will need to be confirmed)  05/26/2028   DTaP/Tdap/Td (3 - Td or Tdap) 03/25/2031   DEXA SCAN  Medicare will usually cover to do this every 2 years, Due.    Hepatitis C Screening  Completed   Zoster Vaccines- Shingrix  Completed   HPV VACCINES  Aged Out   Pneumonia Vaccine 66 Years old  Discontinued   COVID-19 Vaccine  Discontinued    -See a dentist at least yearly  -Get your eyes checked and then per your eye specialist's recommendations  -Other issues addressed today:  -If you wish to quit smoking:  1) call the quit line 1-800- QUITNOW  2) set quit date sometime  in the next few weeks 3) have plan for when you get cravings (take a walk, play a game on phone, use nicotine gum or lozenge, etc.)  -please consider cutting back to 1 beer daily, per haps replace with water with fresh fruit or with seltzer  -I have included below further information regarding a healthy whole foods based diet, physical activity guidelines for adults, stress management and opportunities for social connections. I hope you find this information useful.    -----------------------------------------------------------------------------------------------------------------------------------------------------------------------------------------------------------------------------------------------------------  FOOD - THE FUEL FOR A HAPPY HEALTHY LIFE: -eat real food: lots of colorful vegetables (half the plate) and fruits -5-7 servings of vegetables and fruits per day (fresh or steamed is best), exp. 2 servings of vegetables with lunch and dinner and 2 servings of fruit per day. Berries and greens such as kale and collards are great choices.  -consume on a regular basis: whole grains (make sure first ingredient on label contains the word "whole"), fresh fruits, fish, nuts, seeds, healthy oils (such as olive oil, avocado oil, grape seed oil) -may eat small amounts of dairy and lean meat on occasion, but avoid processed meats such as ham, bacon, lunch meat, etc. -drink water -try to avoid fast food and pre-packaged foods, processed meat -most experts advise limiting sodium to < 237m per day, should limit further is any chronic conditions such as high blood pressure, heart disease, diabetes, etc. The American Heart Association advised that < 15022mis is ideal -try to avoid foods that contain any ingredients with names you do not recognize  -try to avoid sugar/sweets (except for the natural sugar that occurs in fresh fruit) -try to avoid sweet drinks -try to avoid white rice, white bread, pasta (unless whole grain), white or yellow potatoes  MOVE - the key to keeping your body moving and working best: -if you wish to increase your physical activity, do so gradually and with the approval of your doctor -STOP and seek medical care immediately if you have any chest pain, chest discomfort or trouble breathing when starting or increasing exercise  -move and stretch your body, legs, feet and arms when sitting for long periods -Physical activity guidelines  for optimal health in adults: -least 150 minutes per week of aerobic exercise (can talk, but not sing) once approved by your doctor, 20-30 minutes of sustained activity or two 10 minute episodes of sustained activity every day.  -resistance training at least 2 days per week if approved by your doctor -balance exercises 3+ days per week:   Stand somewhere where you have something sturdy to hold onto if you lose balance.    1) lift up on toes, start with 5x per day and work up to 20x   2) stand and lift on leg straight out to the side so that foot is a few inches of the floor, start with 5x each side and work up to 20x each side   3) stand on one foot, start with 5 seconds each side and work up to 20 seconds on each side  If you need ideas or help with getting more active:  -Silver sneakers https://tools.silversneakers.com  -Walk with a Doc: Hthttp://stephens-thompson.biz/-try to include resistance (weight lifting/strength building) and balance exercises twice per week: or the following link for ideas: htChessContest.frhtUpdateClothing.com.cySTRESS MANAGEMENT - so important for health and well being -try meditating, or just sitting quietly with deep breathing while intentionally relaxing all parts of your body for 5 minutes daily  SOCIAL CONNECTIONS: -  options in [place] if you wish to engage in more social and exercise related activities:  -Silver sneakers https://tools.silversneakers.com  -Walk with a Doc: http://stephens-thompson.biz/  -Check out the [place] 50+ section on the [employer] (hiking clubs, book clubs, cards and games, chess, exercise classes, aquatic classes and much more) - see the website for details: https://www.[place].gov/departments/parks-recreation/active-adults50  -YouTube has lots of exercise videos for different ages and abilities as  well  -[place] (a variety of indoor and outdoor inperson activities for adults). 8025503510. [address]  -Virtual Online Classes (a variety of topics): see seniorplanet.org or call (301) 831-0303  -consider volunteering at a school, hospice center, church, senior center or elsewhere           Kimberly Kern, DO

## 2023-01-28 ENCOUNTER — Other Ambulatory Visit: Payer: Self-pay | Admitting: Family Medicine

## 2023-01-28 NOTE — Telephone Encounter (Signed)
Requesting: clorazepate 7.'5mg'$   Contract: 10/01/22 UDS: 09/26/22 Last Visit: 09/26/22 Next Visit: 04/01/23 Last Refill: 08/24/22 #270 and 0RF    Please Advise

## 2023-01-28 NOTE — Telephone Encounter (Signed)
Last OV--09/26/2022 Last RF--#270 no refills on 08/24/2022

## 2023-01-28 NOTE — Telephone Encounter (Signed)
PDMP okay, Rx sent 

## 2023-02-26 ENCOUNTER — Encounter: Payer: Self-pay | Admitting: Internal Medicine

## 2023-04-01 ENCOUNTER — Encounter: Payer: Medicare Other | Admitting: Internal Medicine

## 2023-04-12 ENCOUNTER — Other Ambulatory Visit: Payer: Self-pay | Admitting: Internal Medicine

## 2023-04-16 ENCOUNTER — Encounter: Payer: Self-pay | Admitting: Internal Medicine

## 2023-04-16 ENCOUNTER — Telehealth: Payer: Self-pay

## 2023-04-16 ENCOUNTER — Ambulatory Visit (INDEPENDENT_AMBULATORY_CARE_PROVIDER_SITE_OTHER): Payer: Medicare HMO | Admitting: Internal Medicine

## 2023-04-16 VITALS — BP 126/68 | HR 72 | Temp 98.0°F | Resp 16 | Ht 63.0 in | Wt 128.5 lb

## 2023-04-16 DIAGNOSIS — Z1231 Encounter for screening mammogram for malignant neoplasm of breast: Secondary | ICD-10-CM

## 2023-04-16 DIAGNOSIS — M810 Age-related osteoporosis without current pathological fracture: Secondary | ICD-10-CM | POA: Diagnosis not present

## 2023-04-16 DIAGNOSIS — I10 Essential (primary) hypertension: Secondary | ICD-10-CM | POA: Diagnosis not present

## 2023-04-16 DIAGNOSIS — E785 Hyperlipidemia, unspecified: Secondary | ICD-10-CM | POA: Diagnosis not present

## 2023-04-16 DIAGNOSIS — Z79899 Other long term (current) drug therapy: Secondary | ICD-10-CM | POA: Diagnosis not present

## 2023-04-16 DIAGNOSIS — F419 Anxiety disorder, unspecified: Secondary | ICD-10-CM | POA: Diagnosis not present

## 2023-04-16 LAB — LIPID PANEL
Cholesterol: 139 mg/dL (ref 0–200)
HDL: 52.4 mg/dL (ref >39.00)
NonHDL: 86.57
Total CHOL/HDL Ratio: 3
Triglycerides: 218 mg/dL — ABNORMAL HIGH (ref 0.0–149.0)
VLDL: 43.6 mg/dL — ABNORMAL HIGH (ref 0.0–40.0)

## 2023-04-16 LAB — AST: AST: 27 U/L (ref 0–37)

## 2023-04-16 LAB — BASIC METABOLIC PANEL
BUN: 25 mg/dL — ABNORMAL HIGH (ref 6–23)
CO2: 27 mEq/L (ref 19–32)
Calcium: 10.8 mg/dL — ABNORMAL HIGH (ref 8.4–10.5)
Chloride: 103 mEq/L (ref 96–112)
Creatinine, Ser: 1.43 mg/dL — ABNORMAL HIGH (ref 0.40–1.20)
GFR: 38.17 mL/min — ABNORMAL LOW (ref >60.00)
Glucose, Bld: 98 mg/dL (ref 70–99)
Potassium: 4.7 mEq/L (ref 3.5–5.1)
Sodium: 139 mEq/L (ref 135–145)

## 2023-04-16 LAB — LDL CHOLESTEROL, DIRECT: Direct LDL: 53 mg/dL

## 2023-04-16 LAB — ALT: ALT: 37 U/L — ABNORMAL HIGH (ref 0–35)

## 2023-04-16 NOTE — Patient Instructions (Addendum)
We are working on getting you a Prolia injection  Continue your vitamin D  Consider the following vaccines: RSV, pneumonia shot, COVID vaccine and a flu shot every fall  Check the  blood pressure regularly BP GOAL is between 110/65 and  135/85. If it is consistently higher or lower, let me know     GO TO THE LAB : Get the blood work     GO TO THE FRONT DESK, PLEASE SCHEDULE YOUR APPOINTMENTS Come back for   a physical exam in 6 months  STOP BY THE FIRST FLOOR: Arrange for a mammogram and bone density test

## 2023-04-16 NOTE — Telephone Encounter (Signed)
Needs Prolia benefits ran please. Last injection 09/25/21

## 2023-04-16 NOTE — Assessment & Plan Note (Signed)
HTN:BP today is very good, rec  ambulatory BPs, continue Zestoretic, check labs. Dyslipidemia: On atorvastatin, checking labs. Anxiety: Controlled on Tranxene, UDS today. Osteoporosis: She remains active, taking vitamin D, needs a Prolia, order sent.  Check a DEXA. Preventive care: Order mammogram.  Vaccine advice provided. RTC 6 months CPX.

## 2023-04-16 NOTE — Progress Notes (Signed)
   Subjective:    Patient ID: Kimberly Valenzuela, female    DOB: 1956/11/13, 67 y.o.   MRN: [id number]  DOS:  04/16/2023 Type of visit - description: Routine checkup, refills.  Since the last office visit is doing well. She remains active. Taking vitamin D supplements.  Review of Systems See above   Past Medical History:  Diagnosis Date   Anxiety 08/03/2011   Back pain 2015   MRI, local injection, better after injection   Dyslipidemia    high TG , dx ~ 01-2011   Hypertension    Menopause    after BCP were d/c ~ 2010   Osteoporosis     Past Surgical History:  Procedure Laterality Date   CESAREAN SECTION     COLONOSCOPY     FACELIFT  11/2018   POLYPECTOMY      Current Outpatient Medications  Medication Instructions   aspirin EC 81 mg, Oral, Daily, Swallow whole.   atorvastatin (LIPITOR) 20 MG tablet TAKE 1 TABLET(20 MG) BY MOUTH AT BEDTIME   clorazepate (TRANXENE) 7.5 MG tablet TAKE 1 TABLET BY MOUTH THREE TIMES DAILY AS NEEDED   denosumab (PROLIA) 60 mg, Every 6 months   lisinopril-hydrochlorothiazide (ZESTORETIC) 10-12.5 MG tablet 1 tablet, Oral, Daily   Multiple Vitamins-Calcium (ONE-A-DAY WOMENS FORMULA) TABS 1 each, Oral, Daily, Womens Menopause Formula    vitamin D3 2,000 Units, Oral, Daily       Objective:   Physical Exam BP 126/68   Pulse 72   Temp 98 F (36.7 C) (Oral)   Resp 16   Ht 5\' 3"  (1.6 m)   Wt 128 lb 8 oz (58.3 kg)   SpO2 97%   BMI 22.76 kg/m  General:   Well developed, NAD, BMI noted. HEENT:  Normocephalic . Face symmetric, atraumatic Lungs:  CTA B Normal respiratory effort, no intercostal retractions, no accessory muscle use. Heart: RRR,  no murmur.  Lower extremities: no pretibial edema bilaterally  Skin: Not pale. Not jaundice Neurologic:  alert & oriented X3.  Speech normal, gait appropriate for age and unassisted Psych--  Cognition and judgment appear intact.  Cooperative with normal attention span and concentration.  Behavior  appropriate. No anxious or depressed appearing.      Assessment     Assessment HTN Increase creatinine: Baseline 1.2 >>> 1.59 (06-2015), wnl renal US (09-2015) Dyslipidemia Anxiety - tranxene  Back pain, 2015, had a MRI, improve after a local injection H/o iron deficiency anemia  Osteoporosis  --T score -2.8 (2013), unable to rx reclast --> started Boniva 2014 --T score -3.4 (03-2016)  worse d/t  poor compliance versus no response to [place],  PROLIA #3 07-03-17 Lipoma R upper back +FH ovarian cancer, GM and mother. Had genetic testing  2014 ---> negative Coronary calcifications per CT >>> stress test-2022: Prior infarct, low risk.  Plan is CV RF control COPD-emphysema: Per CT   PLAN:  HTN:BP today is very good, rec  ambulatory BPs, continue Zestoretic, check labs. Dyslipidemia: On atorvastatin, checking labs. Anxiety: Controlled on Tranxene, UDS today. Osteoporosis: She remains active, taking vitamin D, needs a Prolia, order sent.  Check a DEXA. Preventive care: Order mammogram.  Vaccine advice provided. RTC 6 months CPX.

## 2023-04-17 ENCOUNTER — Encounter: Payer: Self-pay | Admitting: Internal Medicine

## 2023-04-18 LAB — DM TEMPLATE

## 2023-04-18 LAB — DRUG MONITORING PANEL 375977 , URINE: Desmethyltramadol: NEGATIVE ng/mL (ref <100)

## 2023-04-19 ENCOUNTER — Telehealth: Payer: Self-pay

## 2023-04-19 LAB — DRUG MONITORING PANEL 375977 , URINE
Alcohol Metabolites: NEGATIVE ng/mL (ref <500)
Alphahydroxyalprazolam: NEGATIVE ng/mL (ref <25)
Alphahydroxymidazolam: NEGATIVE ng/mL (ref <50)
Alphahydroxytriazolam: NEGATIVE ng/mL (ref <50)
Aminoclonazepam: NEGATIVE ng/mL (ref <25)
Amphetamines: NEGATIVE ng/mL (ref <500)
Barbiturates: NEGATIVE ng/mL (ref <300)
Benzodiazepines: POSITIVE ng/mL — AB (ref <100)
Cocaine Metabolite: NEGATIVE ng/mL (ref <150)
Desmethyltramadol: NEGATIVE ng/mL (ref <100)
Hydroxyethylflurazepam: NEGATIVE ng/mL (ref <50)
Lorazepam: NEGATIVE ng/mL (ref <50)
Marijuana Metabolite: 125 ng/mL — ABNORMAL HIGH (ref <5)
Marijuana Metabolite: POSITIVE ng/mL — AB (ref <20)
Nordiazepam: 291 ng/mL — ABNORMAL HIGH (ref <50)
Opiates: NEGATIVE ng/mL (ref <100)
Oxazepam: 2251 ng/mL — ABNORMAL HIGH (ref <50)
Oxycodone: NEGATIVE ng/mL (ref <100)
Temazepam: NEGATIVE ng/mL (ref <50)
Tramadol: NEGATIVE ng/mL (ref <100)

## 2023-04-19 NOTE — Telephone Encounter (Signed)
Created new encounter for Prolia BIV. Will route encounter back once benefit verification is complete.  

## 2023-04-19 NOTE — Telephone Encounter (Signed)
Prolia VOB initiated via [email or web address]  Last Prolia inj: 09/25/21

## 2023-04-24 ENCOUNTER — Telehealth: Payer: Self-pay | Admitting: Internal Medicine

## 2023-04-24 NOTE — Telephone Encounter (Signed)
PDMP okay, Rx sent, next UDS by November

## 2023-04-24 NOTE — Telephone Encounter (Signed)
Requesting: clorazepate 7.5mg   Contract: 04/16/23 UDS: 04/16/23 Last Visit: 04/16/23 Next Visit: 05/07/23 Last Refill: 01/28/23 #270 and 0RF  Moderate risk (marijuana noted for second time)  Please Advise

## 2023-04-26 ENCOUNTER — Other Ambulatory Visit (HOSPITAL_COMMUNITY): Payer: Self-pay

## 2023-04-26 NOTE — Telephone Encounter (Signed)
Pharmacy Patient Advocate Encounter    PA for Prolia submitted to [facility] via CoverMyMeds Key or (Medicaid) confirmation # [id number] Status is pending

## 2023-04-26 NOTE — Telephone Encounter (Signed)
Pt ready for scheduling for PROLIA on or after : 04/26/23  Out-of-pocket cost due at time of visit: $327  Primary: [employer] Prolia co-insurance: 20% Admin fee co-insurance: 20%  Secondary: --- Prolia co-insurance:  Admin fee co-insurance:   Medical Benefit Details: Date Benefits were checked: 04/19/23 Deductible: NO/ Coinsurance: 20%/ Admin Fee: 20%  Prior Auth: APPROVED PA# [id number] Expiration Date: 04/26/23-11/19/23   Pharmacy benefit: Copay $95 If patient wants fill through the pharmacy benefit please send prescription to: [employer], and include estimated need by date in rx notes. Pharmacy will ship medication directly to the office.  Patient NOT eligible for Prolia Copay Card. Copay Card can make patient's cost as little as $25. Link to apply: https://www.amgensupportplus.com/copay  ** This summary of benefits is an estimation of the patient's out-of-pocket cost. Exact cost may very based on individual plan coverage.

## 2023-04-26 NOTE — Telephone Encounter (Signed)
PA APPROVED [facility] 11/19/23

## 2023-04-29 NOTE — Telephone Encounter (Signed)
Pt said she does not want to get prolia shot if insurance is not going to cover it 100%. Pt would like to know if she has to have this shot. Please call to advise.

## 2023-04-29 NOTE — Telephone Encounter (Signed)
Called pt to let her know that she is due for Prolia on 04/26/23. She sees Dr Drue Novel on 05/07/23. If she agrees to the [facility] cost of $327 then she can get the injection at that OV.   She can speak to either [facility] or I when she returns the call.

## 2023-04-30 MED ORDER — DENOSUMAB 60 MG/ML ~~LOC~~ SOSY: PREFILLED_SYRINGE | SUBCUTANEOUS | 0 refills | Status: DC

## 2023-04-30 NOTE — Telephone Encounter (Signed)
--  T score -2.8 (2013), unable to rx reclast --> started Boniva 2014 --T score -3.4 (03-2016)  worse d/t  poor compliance versus no response to Boniva so was switch to Prolia. Now she is unable to afford Prolia. Plan:  -Schedule a DEXA ASAP - Endocrinology referral

## 2023-04-30 NOTE — Addendum Note (Signed)
Addended by: Thelma Barge D on: 04/30/2023 09:35 AM   Modules accepted: Orders

## 2023-04-30 NOTE — Telephone Encounter (Signed)
Spoke with patient again and advised that she can get at specialty pharmacy for $95 and she stated that she can do that.  Rx sent to [facility] and appt scheduled for 05/15/23

## 2023-05-07 ENCOUNTER — Encounter: Payer: Medicare Other | Admitting: Internal Medicine

## 2023-05-07 ENCOUNTER — Encounter: Payer: Self-pay | Admitting: Internal Medicine

## 2023-05-15 ENCOUNTER — Ambulatory Visit (HOSPITAL_BASED_OUTPATIENT_CLINIC_OR_DEPARTMENT_OTHER)
Admission: RE | Admit: 2023-05-15 | Discharge: 2023-05-15 | Disposition: A | Discharge status: Home / Self Care | Payer: Medicare HMO | Source: Ambulatory Visit | Attending: Internal Medicine | Admitting: Internal Medicine

## 2023-05-15 ENCOUNTER — Encounter (HOSPITAL_BASED_OUTPATIENT_CLINIC_OR_DEPARTMENT_OTHER): Payer: Self-pay

## 2023-05-15 ENCOUNTER — Ambulatory Visit: Payer: Medicare HMO

## 2023-05-15 DIAGNOSIS — M810 Age-related osteoporosis without current pathological fracture: Secondary | ICD-10-CM | POA: Diagnosis not present

## 2023-05-15 DIAGNOSIS — Z1231 Encounter for screening mammogram for malignant neoplasm of breast: Secondary | ICD-10-CM | POA: Diagnosis not present

## 2023-05-20 ENCOUNTER — Other Ambulatory Visit: Payer: Self-pay | Admitting: Internal Medicine

## 2023-05-20 DIAGNOSIS — R928 Other abnormal and inconclusive findings on diagnostic imaging of breast: Secondary | ICD-10-CM

## 2023-05-28 ENCOUNTER — Ambulatory Visit
Admission: RE | Admit: 2023-05-28 | Discharge: 2023-05-28 | Disposition: A | Discharge status: Home / Self Care | Payer: Medicare HMO | Source: Ambulatory Visit | Attending: Internal Medicine | Admitting: Internal Medicine

## 2023-05-28 DIAGNOSIS — R928 Other abnormal and inconclusive findings on diagnostic imaging of breast: Secondary | ICD-10-CM

## 2023-05-28 DIAGNOSIS — N6322 Unspecified lump in the left breast, upper inner quadrant: Secondary | ICD-10-CM | POA: Diagnosis not present

## 2023-05-29 ENCOUNTER — Other Ambulatory Visit: Payer: Self-pay | Admitting: Internal Medicine

## 2023-05-29 DIAGNOSIS — N6489 Other specified disorders of breast: Secondary | ICD-10-CM

## 2023-06-19 ENCOUNTER — Telehealth: Payer: Self-pay | Admitting: Internal Medicine

## 2023-06-19 NOTE — Telephone Encounter (Signed)
Spoke with pharmacy and she will have to to call [facility] to give consent and to pay copay.  Pt advised that she will have to call, number given.  Advised that when they call to schedule delivery then I will call her to get scheduled.

## 2023-06-19 NOTE — Telephone Encounter (Signed)
Pt said she received a letter from insurance approving her Prolia shot (6/24-12/24) Pt is unsure of when her last one was. Please call to advise when she should be scheduled and out of pocket costs

## 2023-06-20 NOTE — Telephone Encounter (Signed)
Pt has been scheduled.  °

## 2023-06-20 NOTE — Telephone Encounter (Signed)
Delivery scheduled for 06/25/23. Signature required.  Phone number: [phone] Order number: [phone]

## 2023-06-27 ENCOUNTER — Telehealth: Payer: Self-pay

## 2023-06-27 ENCOUNTER — Ambulatory Visit (INDEPENDENT_AMBULATORY_CARE_PROVIDER_SITE_OTHER): Payer: Medicare HMO

## 2023-06-27 DIAGNOSIS — M810 Age-related osteoporosis without current pathological fracture: Secondary | ICD-10-CM | POA: Diagnosis not present

## 2023-06-27 MED ORDER — DENOSUMAB 60 MG/ML ~~LOC~~ SOSY
60.0000 mg | PREFILLED_SYRINGE | Freq: Once | SUBCUTANEOUS | Status: AC
Start: 2023-06-27 — End: 2023-06-27
  Administered 2023-06-27: 60 mg via SUBCUTANEOUS

## 2023-06-27 NOTE — Telephone Encounter (Signed)
Pt came in today for her Prolia injection, pt tolerated well. Please schedule pt for next injection. Thank you!

## 2023-06-27 NOTE — Telephone Encounter (Signed)
Next injection due on/after 12/28/23

## 2023-06-27 NOTE — Progress Notes (Signed)
Pt her today for Prolia injection per PCP. Injection was given SubQ on R arm pt tolerated well. Pt supplied.

## 2023-07-15 ENCOUNTER — Other Ambulatory Visit: Payer: Self-pay

## 2023-07-15 MED ORDER — ATORVASTATIN CALCIUM 20 MG PO TABS: 20.0000 mg | ORAL_TABLET | Freq: Every day | ORAL | 0 refills | Status: DC

## 2023-08-23 IMAGING — US US RENAL
1 series · 14 of 25 positions shown · non-contrast
Comparison: None Available.

CLINICAL DATA: Chronic renal disease

EXAM:
RENAL / URINARY TRACT ULTRASOUND COMPLETE

[Series 1: us renal · 14 of 37 slices shown]
[im 1/37]
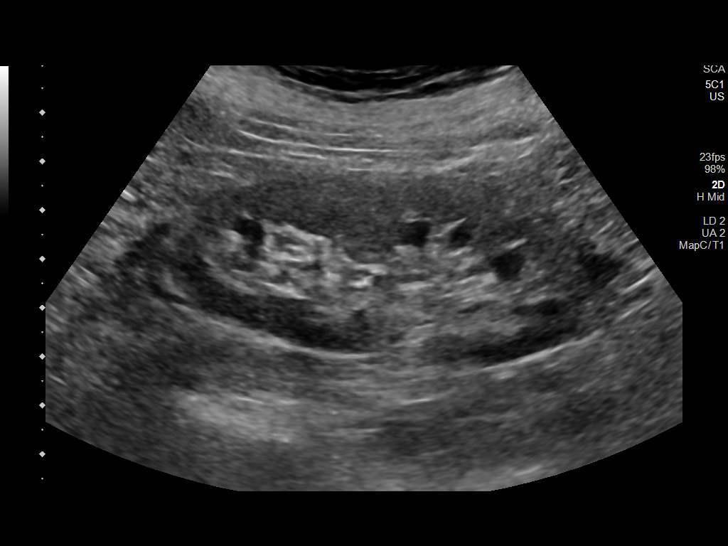
[im 4/37]
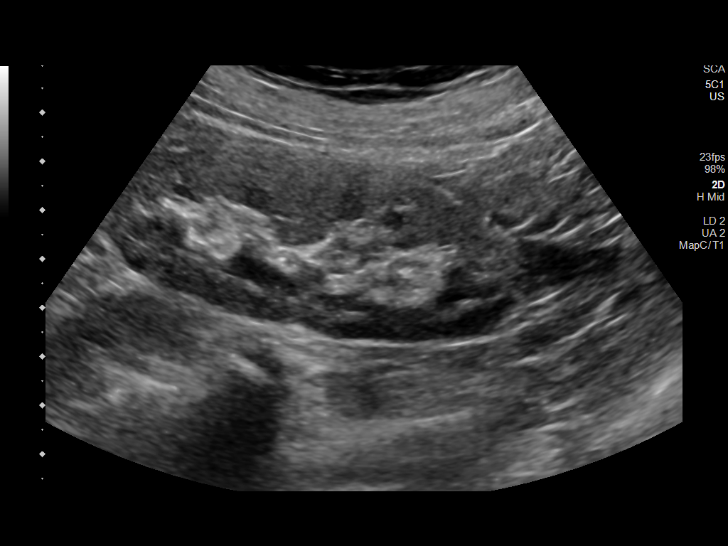
[im 7/37]
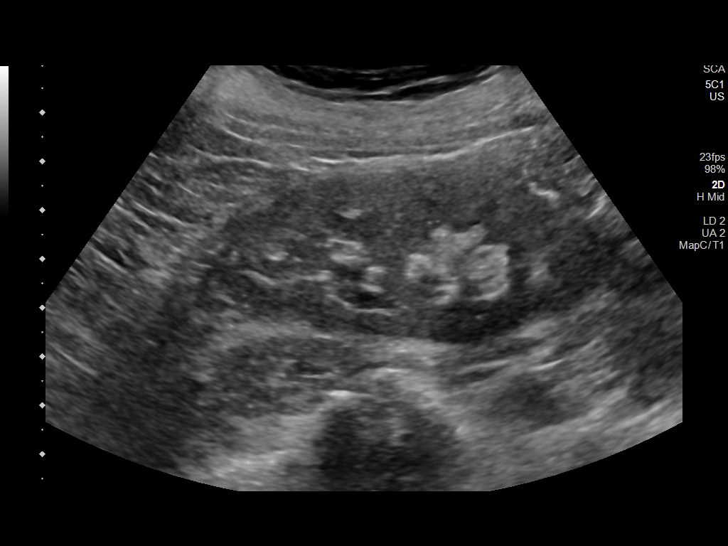
[im 10/37]
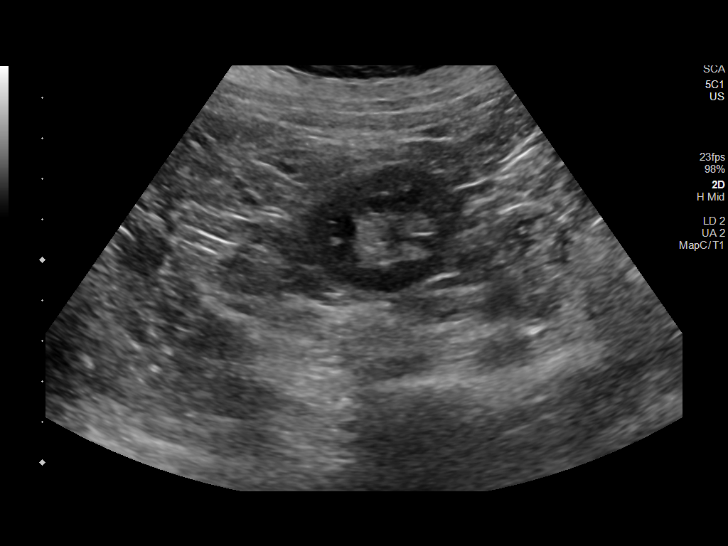
[im 13/37]
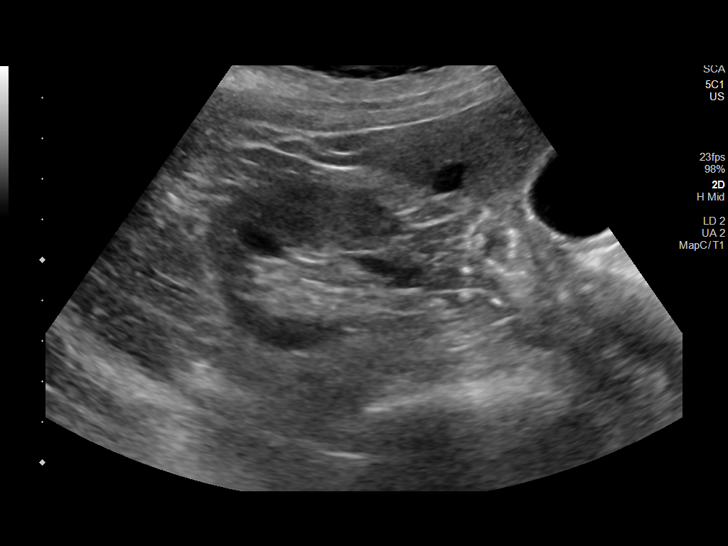
[im 14/37]
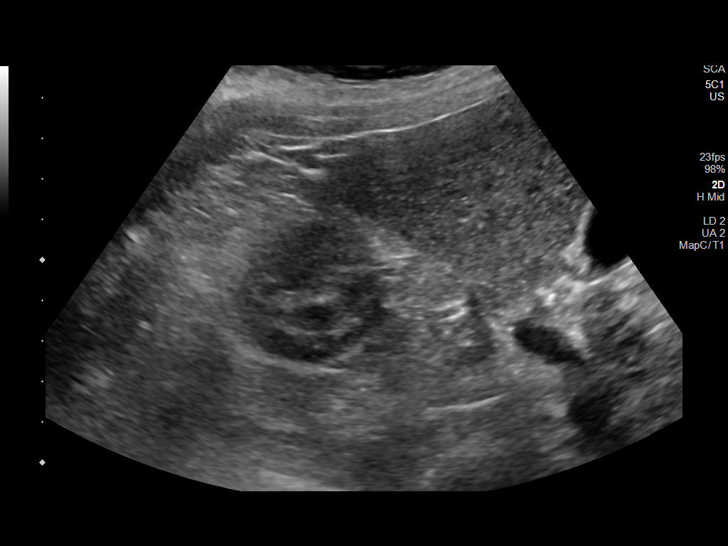
[im 17/37]
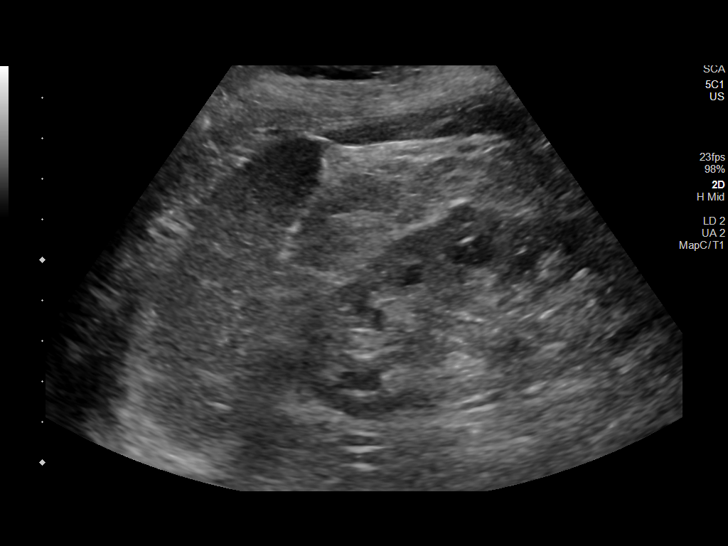
[im 20/37]
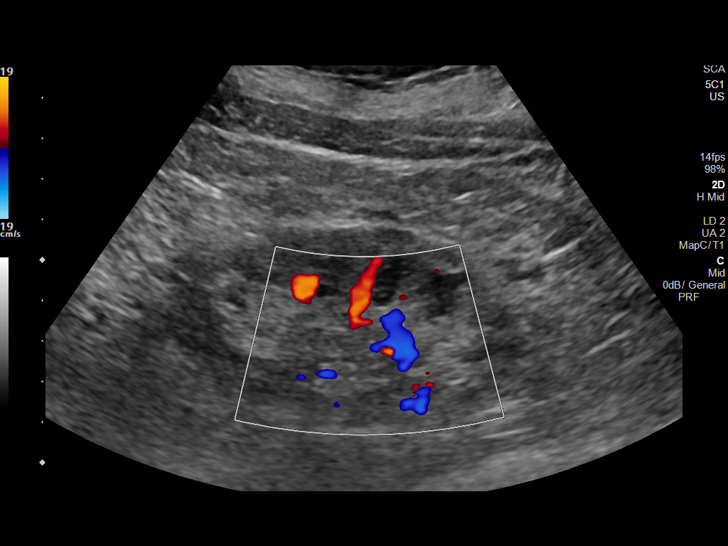
[im 23/37]
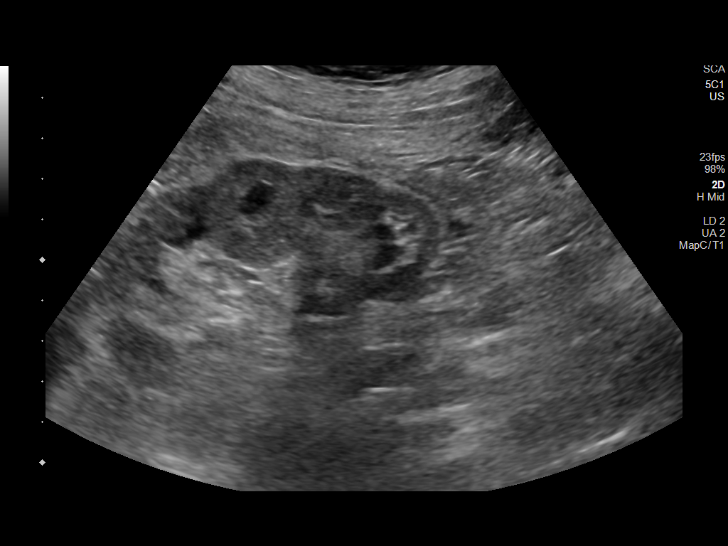
[im 25/37]
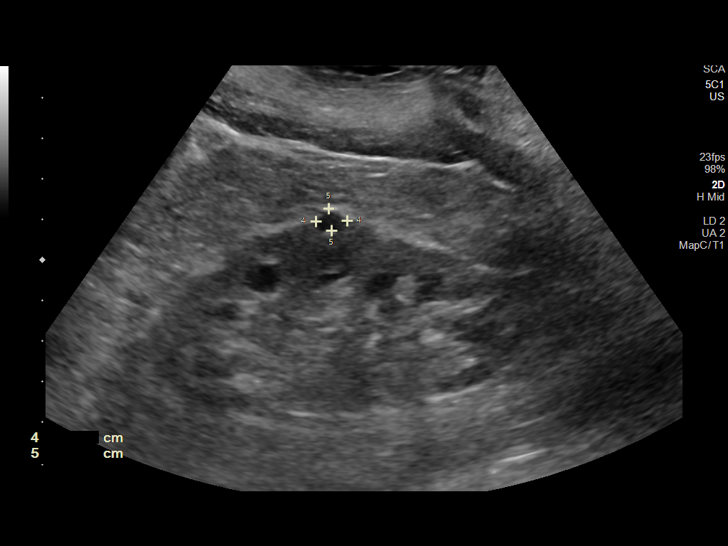
[im 28/37]
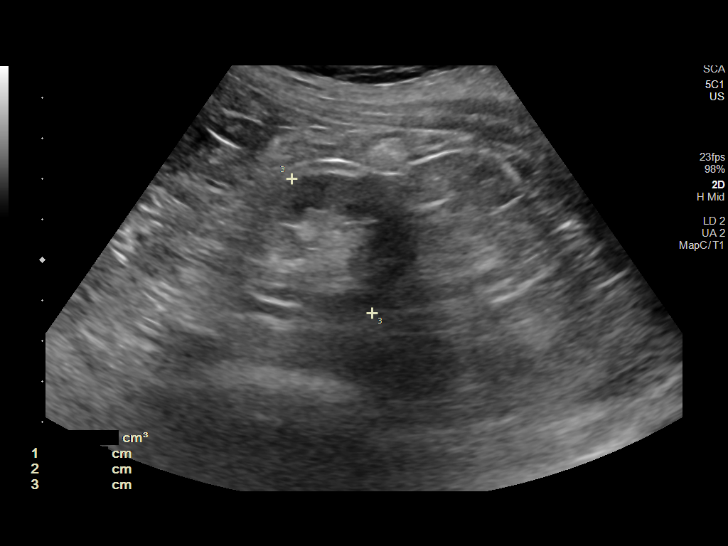
[im 31/37]
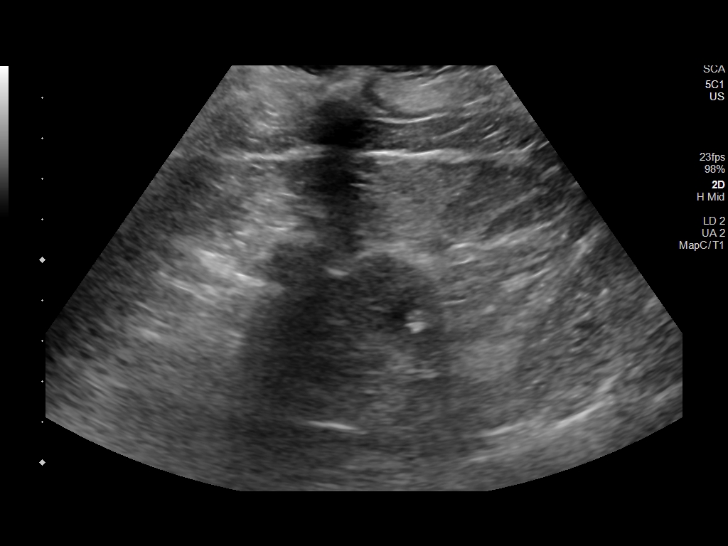
[im 34/37]
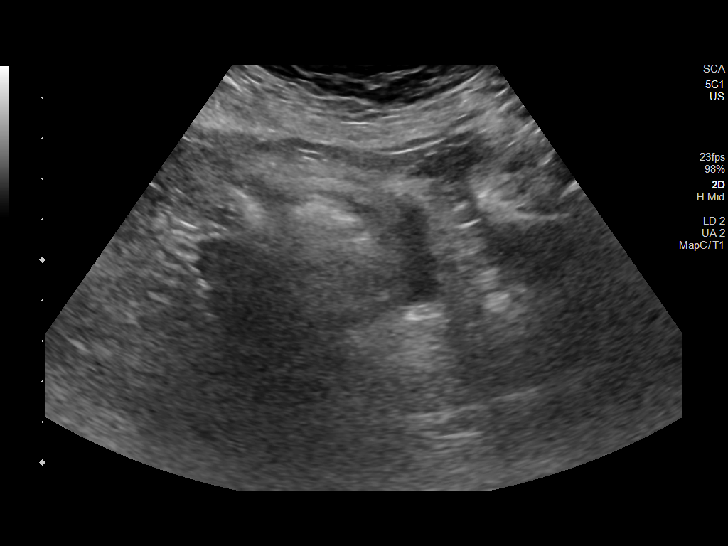
[im 37/37]
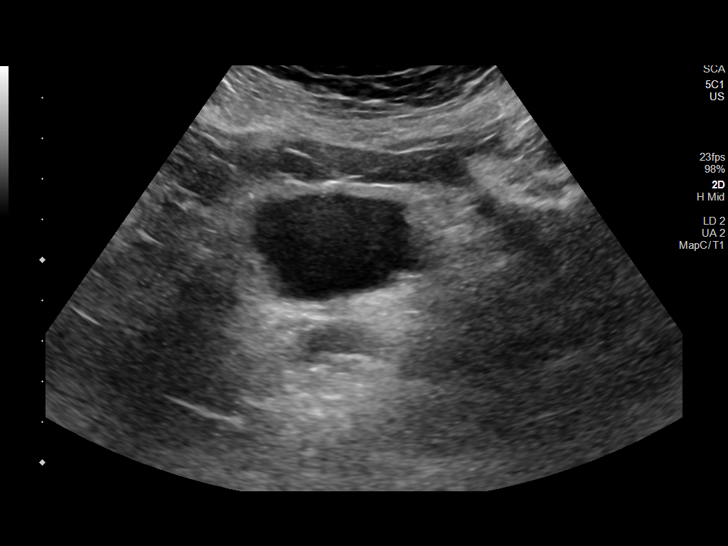

[14 of 25 positions shown; findings below may reference images not displayed]

FINDINGS: Right Kidney:

Renal measurements: 8.9 x 3.6 x 4.3 cm = volume: 72.4 mL.
Echogenicity within normal limits. No mass or hydronephrosis
visualized.

Left Kidney:

Renal measurements: 9.1 x 4.5 x 3.9 cm = volume: 83.8 mL. Contains
an 8 mm simple cyst of no clinical significance. No follow-up
imaging recommended for the cyst.

Bladder:

Appears normal for degree of bladder distention.

Other:

None.
IMPRESSION: No significant abnormalities.

## 2023-08-30 ENCOUNTER — Encounter (HOSPITAL_BASED_OUTPATIENT_CLINIC_OR_DEPARTMENT_OTHER): Payer: Self-pay

## 2023-08-30 ENCOUNTER — Ambulatory Visit (HOSPITAL_BASED_OUTPATIENT_CLINIC_OR_DEPARTMENT_OTHER)
Admission: RE | Admit: 2023-08-30 | Discharge: 2023-08-30 | Disposition: A | Discharge status: Home / Self Care | Payer: Medicare HMO | Source: Ambulatory Visit | Attending: Acute Care | Admitting: Acute Care

## 2023-08-30 DIAGNOSIS — F17210 Nicotine dependence, cigarettes, uncomplicated: Secondary | ICD-10-CM | POA: Diagnosis not present

## 2023-08-30 DIAGNOSIS — Z122 Encounter for screening for malignant neoplasm of respiratory organs: Secondary | ICD-10-CM | POA: Diagnosis not present

## 2023-08-30 DIAGNOSIS — Z87891 Personal history of nicotine dependence: Secondary | ICD-10-CM | POA: Insufficient documentation

## 2023-09-24 ENCOUNTER — Other Ambulatory Visit: Payer: Self-pay

## 2023-09-24 DIAGNOSIS — Z122 Encounter for screening for malignant neoplasm of respiratory organs: Secondary | ICD-10-CM

## 2023-09-24 DIAGNOSIS — Z87891 Personal history of nicotine dependence: Secondary | ICD-10-CM

## 2023-09-24 DIAGNOSIS — F17210 Nicotine dependence, cigarettes, uncomplicated: Secondary | ICD-10-CM

## 2023-09-26 ENCOUNTER — Other Ambulatory Visit (HOSPITAL_COMMUNITY): Payer: Self-pay

## 2023-09-26 NOTE — Telephone Encounter (Signed)
Pharmacy Patient Advocate Encounter  Received notification from [facility] that Prior Authorization for Prolia has been APPROVED from 11/20/23 to 11/18/24   PA #/Case ID/Reference #: [id number]   Approval letter indexed to media tab

## 2023-10-03 ENCOUNTER — Encounter: Payer: Self-pay | Admitting: Internal Medicine

## 2023-10-09 ENCOUNTER — Other Ambulatory Visit: Payer: Self-pay | Admitting: Internal Medicine

## 2023-10-31 ENCOUNTER — Ambulatory Visit: Payer: Medicare HMO

## 2023-10-31 VITALS — Ht 63.0 in | Wt 128.0 lb

## 2023-10-31 DIAGNOSIS — Z0000 Encounter for general adult medical examination without abnormal findings: Secondary | ICD-10-CM

## 2023-10-31 NOTE — Progress Notes (Signed)
Subjective:   Kimberly Valenzuela is a 67 y.o. female who presents for Medicare Annual (Subsequent) preventive examination.  Visit Complete: Virtual I connected with  Chella L Delk on 10/31/23 by a audio enabled telemedicine application and verified that I am speaking with the correct person using two identifiers.  Patient Location: Home  Provider Location: Home Office  I discussed the limitations of evaluation and management by telemedicine. The patient expressed understanding and agreed to proceed.  Vital Signs: Because this visit was a virtual/telehealth visit, some criteria may be missing or patient reported. Any vitals not documented were not able to be obtained and vitals that have been documented are patient reported.    Cardiac Risk Factors include: advanced age (>38men, >37 women);hypertension     Objective:    Today's Vitals   10/31/23 1157  Weight: 128 lb (58.1 kg)  Height: 5\' 3"  (1.6 m)   Body mass index is 22.67 kg/m.     10/31/2023   12:05 PM 05/18/2020    9:29 AM  Advanced Directives  Does Patient Have a Medical Advance Directive? Yes No  Type of Estate agent of [place];Living will   Copy of Healthcare Power of Attorney in Chart? No - copy requested   Would patient like information on creating a medical advance directive?  No - Patient declined    Current Medications (verified) Outpatient Encounter Medications as of 10/31/2023  Medication Sig   aspirin 81 MG EC tablet Take 81 mg by mouth daily. Swallow whole.   atorvastatin (LIPITOR) 20 MG tablet Take 1 tablet (20 mg total) by mouth at bedtime.   Cholecalciferol (VITAMIN D3) 50 MCG (2000 UT) CAPS Take 2,000 Units by mouth daily.   clorazepate (TRANXENE) 7.5 MG tablet TAKE 1 TABLET BY MOUTH THREE TIMES DAILY AS NEEDED   denosumab (PROLIA) 60 MG/ML SOSY injection Please send to office.  Pt will get at office.  Pt appt is 05/15/23.  Dx code: M81.0   lisinopril-hydrochlorothiazide  (ZESTORETIC) 10-12.5 MG tablet Take 1 tablet by mouth daily.   Multiple Vitamins-Calcium (ONE-A-DAY WOMENS FORMULA) TABS Take 1 each by mouth daily. Womens Menopause Formula   No facility-administered encounter medications on file as of 10/31/2023.    Allergies (verified) Patient has no known allergies.   History: Past Medical History:  Diagnosis Date   Anxiety 08/03/2011   Back pain 2015   MRI, local injection, better after injection   Dyslipidemia    high TG , dx ~ 01-2011   Hypertension    Menopause    after BCP were d/c ~ 2010   Osteoporosis    Past Surgical History:  Procedure Laterality Date   CESAREAN SECTION     COLONOSCOPY     FACELIFT  11/2018   POLYPECTOMY     Family History  Problem Relation Age of Onset   Ovarian cancer Mother 58   Colon cancer Maternal Grandmother 12       dx in her 61s   Renal cancer Maternal Grandfather        dx in his 13s   Lung cancer Maternal Uncle        smoker   Diabetes Neg Hx    Coronary artery disease Neg Hx    Stroke Neg Hx    Breast cancer Neg Hx    Esophageal cancer Neg Hx    Liver cancer Neg Hx    Pancreatic cancer Neg Hx    Rectal cancer Neg Hx  Stomach cancer Neg Hx    Social History   Socioeconomic History   Marital status: Widowed    Spouse name: Not on file   Number of children: 1   Years of education: Not on file   Highest education level: Not on file  Occupational History   Occupation: retired/ Airline pilot   Tobacco Use   Smoking status: Every Day    Current packs/day: 1.00    Average packs/day: 1 pack/day for 40.0 years (40.0 ttl pk-yrs)    Types: Cigarettes   Smokeless tobacco: Never   Tobacco comments:    ~ 1/2 ppd  Vaping Use   Vaping status: Never Used  Substance and Sexual Activity   Alcohol use: Yes    Comment: socially    Drug use: Yes    Frequency: 4.0 times per week    Types: Marijuana    Comment: last smoked yesterday marijuana   Sexual activity: Not Currently    Birth  control/protection: Post-menopausal  Other Topics Concern   Not on file  Social History Narrative   Lost parents 24   Lost her husband ~ 01-2018   Lives by herself   Sister-brother-son: live in the area        Social Drivers of Health   Financial Resource Strain: Low Risk  (10/31/2023)   Overall Financial Resource Strain (CARDIA)    Difficulty of Paying Living Expenses: Not hard at all  Food Insecurity: No Food Insecurity (10/31/2023)   Hunger Vital Sign    Worried About Running Out of Food in the Last Year: Never true    Ran Out of Food in the Last Year: Never true  Transportation Needs: No Transportation Needs (10/31/2023)   PRAPARE - Administrator, Civil Service (Medical): No    Lack of Transportation (Non-Medical): No  Physical Activity: Insufficiently Active (10/31/2023)   Exercise Vital Sign    Days of Exercise per Week: 1 day    Minutes of Exercise per Session: 10 min  Stress: No Stress Concern Present (10/31/2023)   [employer] of Occupational Health - Occupational Stress Questionnaire    Feeling of Stress : Not at all  Social Connections: Socially Isolated (10/31/2023)   Social Connection and Isolation Panel [NHANES]    Frequency of Communication with Friends and Family: More than three times a week    Frequency of Social Gatherings with Friends and Family: More than three times a week    Attends Religious Services: Never    Database administrator or Organizations: No    Attends Banker Meetings: Never    Marital Status: Widowed    Tobacco Counseling Ready to quit: No Counseling given: Yes Tobacco comments: ~ 1/2 ppd   Clinical Intake:  Pre-visit preparation completed: Yes  Pain : No/denies pain     BMI - recorded: 22.67 Nutritional Status: BMI of 19-24  Normal Nutritional Risks: None Diabetes: No  How often do you need to have someone help you when you read instructions, pamphlets, or other written materials from  your doctor or pharmacy?: 1 - Never  Interpreter Needed?: No  Information entered by :: Theresa Mulligan LPN   Activities of Daily Living    10/31/2023   12:04 PM  In your present state of health, do you have any difficulty performing the following activities:  Hearing? 0  Vision? 0  Difficulty concentrating or making decisions? 0  Walking or climbing stairs? 0  Dressing or bathing? 0  Doing errands,  shopping? 0  Preparing Food and eating ? N  Using the Toilet? N  In the past six months, have you accidently leaked urine? N  Do you have problems with loss of bowel control? N  Managing your Medications? N  Managing your Finances? N  Housekeeping or managing your Housekeeping? N    Patient Care Team: Wanda Plump, MD as PCP - General (Internal Medicine) Levie Heritage, DO as Attending Physician (Obstetrics and Gynecology) Iva Boop, MD as Consulting Physician (Gastroenterology) [facility]  Indicate any recent Medical Services you may have received from other than [facility] providers in the past year (date may be approximate).     Assessment:   This is a routine wellness examination for [place].  Hearing/Vision screen Hearing Screening - Comments:: Denies hearing difficulties   Vision Screening - Comments:: Wears rx glasses - up to date with routine eye exams with  [facility]   Goals Addressed               This Visit's Progress     Increase physical activity (pt-stated)         Depression Screen    10/31/2023   12:02 PM 04/16/2023   11:13 AM 10/25/2022   12:22 PM 09/26/2022    9:20 AM 03/26/2022    9:01 AM 09/25/2021    8:30 AM 03/24/2021    8:09 AM  PHQ 2/9 Scores  PHQ - 2 Score 0 0 0 0 0 0 1    Fall Risk    10/31/2023   12:04 PM 04/16/2023   11:13 AM 10/25/2022   12:22 PM 09/26/2022    9:20 AM 03/26/2022    9:00 AM  Fall Risk   Falls in the past year? 0 0 0 0 0  Number falls in past yr: 0 0 0 0 0  Injury with Fall? 0 0 0 0 0  Risk for  fall due to : No Fall Risks  No Fall Risks    Follow up Falls prevention discussed Falls evaluation completed Falls evaluation completed Falls evaluation completed Falls evaluation completed    MEDICARE RISK AT HOME: Medicare Risk at Home Any stairs in or around the home?: No If so, are there any without handrails?: No Home free of loose throw rugs in walkways, pet beds, electrical cords, etc?: Yes Adequate lighting in your home to reduce risk of falls?: Yes Life alert?: No Use of a cane, walker or w/c?: No Grab bars in the bathroom?: No Shower chair or bench in shower?: No Elevated toilet seat or a handicapped toilet?: Yes  TIMED UP AND GO:  Was the test performed?  No    Cognitive Function:        10/31/2023   12:05 PM  6CIT Screen  What Year? 0 points  What month? 0 points  What time? 0 points  Count back from 20 0 points  Months in reverse 0 points  Repeat phrase 0 points  Total Score 0 points    Immunizations Immunization History  Administered Date(s) Administered   Tdap 08/03/2011, 03/24/2021   Zoster Recombinant(Shingrix) 01/05/2019, 03/06/2019    TDAP status: Up to date  Flu Vaccine status: Declined, Education has been provided regarding the importance of this vaccine but patient still declined. Advised may receive this vaccine at local pharmacy or Health Dept. Aware to provide a copy of the vaccination record if obtained from local pharmacy or Health Dept. Verbalized acceptance and understanding.  Qualifies for Shingles Vaccine? Yes   Zostavax completed Yes   Shingrix Completed?: Yes  Screening Tests Health Maintenance  Topic Date Due   INFLUENZA VACCINE  02/17/2024 (Originally 06/20/2023)   MAMMOGRAM  05/14/2024   Lung Cancer Screening  08/29/2024   Medicare Annual Wellness (AWV)  10/30/2024   Colonoscopy  05/26/2028   DTaP/Tdap/Td (3 - Td or Tdap) 03/25/2031   DEXA SCAN  Completed   Hepatitis C Screening  Completed   Zoster Vaccines-  Shingrix  Completed   HPV VACCINES  Aged Out   Pneumonia Vaccine 70+ Years old  Discontinued   COVID-19 Vaccine  Discontinued    Health Maintenance  There are no preventive care reminders to display for this patient.   Colorectal cancer screening: Type of screening: Colonoscopy. Completed 05/26/18. Repeat every 10 years  Mammogram status: Completed 05/15/23. Repeat every year  Bone Density status: Completed 12/14/22. Results reflect: Bone density results: OSTEOPENIA. Repeat every   years.     Additional Screening:  Hepatitis C Screening: does qualify; Completed 07/05/15  Vision Screening: Recommended annual ophthalmology exams for early detection of glaucoma and other disorders of the eye. Is the patient up to date with their annual eye exam?  Yes  Who is the provider or what is the name of the office in which the patient attends annual eye exams? [facility] If pt is not established with a provider, would they like to be referred to a provider to establish care? No .   Dental Screening: Recommended annual dental exams for proper oral hygiene    Community Resource Referral / Chronic Care Management:  CRR required this visit?  No   CCM required this visit?  No     Plan:     I have personally reviewed and noted the following in the patient's chart:   Medical and social history Use of alcohol, tobacco or illicit drugs  Current medications and supplements including opioid prescriptions. Patient is not currently taking opioid prescriptions. Functional ability and status Nutritional status Physical activity Advanced directives List of other physicians Hospitalizations, surgeries, and ER visits in previous 12 months Vitals Screenings to include cognitive, depression, and falls Referrals and appointments  In addition, I have reviewed and discussed with patient certain preventive protocols, quality metrics, and best practice recommendations. A written personalized care plan  for preventive services as well as general preventive health recommendations were provided to patient.     Tillie Rung, LPN   [id number]   After Visit Summary: (MyChart) Due to this being a telephonic visit, the after visit summary with patients personalized plan was offered to patient via MyChart   Nurse Notes: None

## 2023-10-31 NOTE — Patient Instructions (Addendum)
Kimberly Valenzuela , Thank you for taking time to come for your Medicare Wellness Visit. I appreciate your ongoing commitment to your health goals. Please review the following plan we discussed and let me know if I can assist you in the future.   Referrals/Orders/Follow-Ups/Clinician Recommendations:   This is a list of the screening recommended for you and due dates:  Health Maintenance  Topic Date Due   Flu Shot  02/17/2024*   Mammogram  05/14/2024   Screening for Lung Cancer  08/29/2024   Medicare Annual Wellness Visit  10/30/2024   Colon Cancer Screening  05/26/2028   DTaP/Tdap/Td vaccine (3 - Td or Tdap) 03/25/2031   DEXA scan (bone density measurement)  Completed   Hepatitis C Screening  Completed   Zoster (Shingles) Vaccine  Completed   HPV Vaccine  Aged Out   Pneumonia Vaccine  Discontinued   COVID-19 Vaccine  Discontinued  *Topic was postponed. The date shown is not the original due date.    Advanced directives: (Copy Requested) Please bring a copy of your health care power of attorney and living will to the office to be added to your chart at your convenience.  Next Medicare Annual Wellness Visit scheduled for next year: Yes

## 2023-11-08 ENCOUNTER — Other Ambulatory Visit: Payer: Self-pay | Admitting: Internal Medicine

## 2023-11-12 ENCOUNTER — Encounter: Payer: Self-pay | Admitting: Internal Medicine

## 2023-11-12 ENCOUNTER — Ambulatory Visit (INDEPENDENT_AMBULATORY_CARE_PROVIDER_SITE_OTHER): Payer: Medicare HMO | Admitting: Internal Medicine

## 2023-11-12 VITALS — BP 128/74 | HR 72 | Temp 98.1°F | Resp 16 | Ht 63.0 in | Wt 135.0 lb

## 2023-11-12 DIAGNOSIS — Z79899 Other long term (current) drug therapy: Secondary | ICD-10-CM | POA: Diagnosis not present

## 2023-11-12 DIAGNOSIS — I10 Essential (primary) hypertension: Secondary | ICD-10-CM

## 2023-11-12 DIAGNOSIS — E785 Hyperlipidemia, unspecified: Secondary | ICD-10-CM

## 2023-11-12 DIAGNOSIS — F419 Anxiety disorder, unspecified: Secondary | ICD-10-CM

## 2023-11-12 DIAGNOSIS — N1832 Chronic kidney disease, stage 3b: Secondary | ICD-10-CM

## 2023-11-12 LAB — CBC WITH DIFFERENTIAL/PLATELET
Basophils Absolute: 0 10*3/uL (ref 0.0–0.1)
Basophils Relative: 0.5 % (ref 0.0–3.0)
Eosinophils Absolute: 0.2 10*3/uL (ref 0.0–0.7)
Eosinophils Relative: 2.7 % (ref 0.0–5.0)
HCT: 31.5 % — ABNORMAL LOW (ref 36.0–46.0)
Hemoglobin: 10.9 g/dL — ABNORMAL LOW (ref 12.0–15.0)
Lymphocytes Relative: 27.7 % (ref 12.0–46.0)
Lymphs Abs: 2 10*3/uL (ref 0.7–4.0)
MCHC: 34.7 g/dL (ref 30.0–36.0)
MCV: 98.9 fL (ref 78.0–100.0)
Monocytes Absolute: 0.4 10*3/uL (ref 0.1–1.0)
Monocytes Relative: 5.6 % (ref 3.0–12.0)
Neutro Abs: 4.7 10*3/uL (ref 1.4–7.7)
Neutrophils Relative %: 63.5 % (ref 43.0–77.0)
Platelets: 284 10*3/uL (ref 150.0–400.0)
RBC: 3.18 Mil/uL — ABNORMAL LOW (ref 3.87–5.11)
RDW: 12.7 % (ref 11.5–15.5)
WBC: 7.3 10*3/uL (ref 4.0–10.5)

## 2023-11-12 LAB — BASIC METABOLIC PANEL
BUN: 22 mg/dL (ref 6–23)
CO2: 24 meq/L (ref 19–32)
Calcium: 8.9 mg/dL (ref 8.4–10.5)
Chloride: 102 meq/L (ref 96–112)
Creatinine, Ser: 1.54 mg/dL — ABNORMAL HIGH (ref 0.40–1.20)
GFR: 34.78 mL/min — ABNORMAL LOW (ref >60.00)
Glucose, Bld: 96 mg/dL (ref 70–99)
Potassium: 4.8 meq/L (ref 3.5–5.1)
Sodium: 134 meq/L — ABNORMAL LOW (ref 135–145)

## 2023-11-12 MED ORDER — LISINOPRIL-HYDROCHLOROTHIAZIDE 10-12.5 MG PO TABS: 1.0000 | ORAL_TABLET | Freq: Every day | ORAL | 4 refills | Status: DC

## 2023-11-12 MED ORDER — ATORVASTATIN CALCIUM 20 MG PO TABS: 20.0000 mg | ORAL_TABLET | Freq: Every day | ORAL | 4 refills | Status: DC

## 2023-11-12 MED ORDER — CLORAZEPATE DIPOTASSIUM 7.5 MG PO TABS: ORAL_TABLET | ORAL | 0 refills | Status: DC

## 2023-11-12 NOTE — Progress Notes (Signed)
Subjective:    Patient ID: Kimberly Valenzuela, female    DOB: December 21, 1955, 67 y.o.   MRN: [id number]  DOS:  11/12/2023 Type of visit - description: Follow-up  Here for follow-up, chronic medical problems addressed. Reports good medication compliance. She is trying to remain active with chair exercises.  Denies nausea vomiting.  No diarrhea or blood in the stools.   Review of Systems See above   Past Medical History:  Diagnosis Date   Anxiety 08/03/2011   Back pain 2015   MRI, local injection, better after injection   Dyslipidemia    high TG , dx ~ 01-2011   Hypertension    Menopause    after BCP were d/c ~ 2010   Osteoporosis     Past Surgical History:  Procedure Laterality Date   CESAREAN SECTION     COLONOSCOPY     FACELIFT  11/2018   POLYPECTOMY      Current Outpatient Medications  Medication Instructions   aspirin EC 81 mg, Daily   atorvastatin (LIPITOR) 20 mg, Oral, Daily at bedtime   clorazepate (TRANXENE) 7.5 MG tablet TAKE 1 TABLET BY MOUTH THREE TIMES DAILY AS NEEDED   denosumab (PROLIA) 60 MG/ML SOSY injection Please send to office.  Pt will get at office.  Pt appt is 05/15/23.  Dx code: M81.0   lisinopril-hydrochlorothiazide (ZESTORETIC) 10-12.5 MG tablet 1 tablet, Oral, Daily   Multiple Vitamins-Calcium (ONE-A-DAY WOMENS FORMULA) TABS 1 each, Daily   VITAJOY BIOTIN GUMMIES PO Take by mouth.   vitamin D3 2,000 Units, Daily       Objective:   Physical Exam BP 128/74   Pulse 72   Temp 98.1 F (36.7 C) (Oral)   Resp 16   Ht 5\' 3"  (1.6 m)   Wt 135 lb (61.2 kg)   SpO2 97%   BMI 23.91 kg/m  General:   Well developed, NAD, BMI noted. HEENT:  Normocephalic . Face symmetric, atraumatic Lungs:  CTA B Normal respiratory effort, no intercostal retractions, no accessory muscle use. Heart: RRR,  no murmur.  Lower extremities: no pretibial edema bilaterally  Skin: Not pale. Not jaundice Neurologic:  alert & oriented X3.  Speech normal, gait appropriate  for age and unassisted Psych--  Cognition and judgment appear intact.  Cooperative with normal attention span and concentration.  Behavior appropriate. No anxious or depressed appearing.      Assessment      Assessment HTN CKD : Baseline 1.2 >>> 1.59 (06-2015), wnl renal US (09-2015) Dyslipidemia Anxiety - tranxene  Back pain, 2015, had a MRI, improve after a local injection H/o iron deficiency anemia  Osteoporosis  --T score -2.8 (2013)-- rx started Boniva 2014 --T score -3.4 (03-2016)  worse ; rx  PROLIA #3 07-03-17.   --05/15/2023, T-score -3.0, improving.  On Prolia Lipoma R upper back +FH ovarian cancer, GM and mother. Had genetic testing  2014 ---> negative Coronary calcifications per CT >>> stress test-2022: Prior infarct, low risk.  Plan is CV RF control COPD-emphysema: Per CT   PLAN: HTN: On Zestoretic, no recent ambulatory BPs but BP today is good.  No change.  RF sent, check BMP. High cholesterol: Refill atorvastatin, last LDL very good. Osteoporosis: T-score 05/15/2023: -3.0, improving, next Prolia 12-2023  CKD: Renal US 03/2022 with no obstruction, has declined nephrology referral before.  Monitoring renal function today. Anemia: Slightly low hemoglobin, no GI symptoms, last year ferritin was normal.  Recheck a CBC. Anxiety: Well-controlled on Tranxene, PDMP okay, RF  sent.  Check UDS and contract. Marijuana: UDS showed marijuana, she admits that smokes frequently. Preventive care reviewed:Marland Kitchen   Due for a CPX, encouraged to schedule. Vaccines I recommend: Flu shot, COVID-vaccine, pneumonia shot.  She strongly declines LDCT for lung cancer screening 08/30/2023 MMG: 05-2023. RTC 5 to 6 months

## 2023-11-12 NOTE — Assessment & Plan Note (Signed)
HTN: On Zestoretic, no recent ambulatory BPs but BP today is good.  No change.  RF sent, check BMP. High cholesterol: Refill atorvastatin, last LDL very good. Osteoporosis: T-score 05/15/2023: -3.0, improving, next Prolia 12-2023  CKD: Renal US 03/2022 with no obstruction, has declined nephrology referral before.  Monitoring renal function today. Anemia: Slightly low hemoglobin, no GI symptoms, last year ferritin was normal.  Recheck a CBC. Anxiety: Well-controlled on Tranxene, PDMP okay, RF sent.  Check UDS and contract. Marijuana: UDS showed marijuana, she admits that smokes frequently. Preventive care reviewed:Marland Kitchen   Due for a CPX, encouraged to schedule. Vaccines I recommend: Flu shot, COVID-vaccine, pneumonia shot.  She strongly declines LDCT for lung cancer screening 08/30/2023 MMG: 05-2023. RTC 5 to 6 months

## 2023-11-12 NOTE — Patient Instructions (Addendum)
  Check the  blood pressure regularly Blood pressure goal:  between 110/65 and  135/85. If it is consistently higher or lower, let me know     GO TO THE LAB : Get the blood work     Next visit with me in 4 to 5 months for a physical exam.  You can schedule it at the front desk.

## 2023-11-16 LAB — DRUG MONITORING PANEL 375977 , URINE
Alcohol Metabolites: POSITIVE ng/mL — AB (ref <500)
Alphahydroxyalprazolam: NEGATIVE ng/mL (ref <25)
Alphahydroxymidazolam: NEGATIVE ng/mL (ref <50)
Alphahydroxytriazolam: NEGATIVE ng/mL (ref <50)
Aminoclonazepam: NEGATIVE ng/mL (ref <25)
Amphetamines: NEGATIVE ng/mL (ref <500)
Barbiturates: NEGATIVE ng/mL (ref <300)
Benzodiazepines: POSITIVE ng/mL — AB (ref <100)
Cocaine Metabolite: NEGATIVE ng/mL (ref <150)
Desmethyltramadol: NEGATIVE ng/mL (ref <100)
Ethyl Glucuronide (ETG): 4011 ng/mL — ABNORMAL HIGH (ref <500)
Ethyl Sulfate (ETS): 1420 ng/mL — ABNORMAL HIGH (ref <100)
Hydroxyethylflurazepam: NEGATIVE ng/mL (ref <50)
Lorazepam: NEGATIVE ng/mL (ref <50)
Marijuana Metabolite: 154 ng/mL — ABNORMAL HIGH (ref <5)
Marijuana Metabolite: POSITIVE ng/mL — AB (ref <20)
Nordiazepam: NEGATIVE ng/mL (ref <50)
Opiates: NEGATIVE ng/mL (ref <100)
Oxazepam: 1575 ng/mL — ABNORMAL HIGH (ref <50)
Oxycodone: NEGATIVE ng/mL (ref <100)
Temazepam: NEGATIVE ng/mL (ref <50)
Tramadol: NEGATIVE ng/mL (ref <100)

## 2023-11-16 LAB — DM TEMPLATE

## 2023-11-27 ENCOUNTER — Telehealth: Payer: Self-pay

## 2023-11-27 ENCOUNTER — Other Ambulatory Visit: Payer: Self-pay

## 2023-11-27 DIAGNOSIS — M810 Age-related osteoporosis without current pathological fracture: Secondary | ICD-10-CM

## 2023-11-27 MED ORDER — DENOSUMAB 60 MG/ML ~~LOC~~ SOSY
60.0000 mg | PREFILLED_SYRINGE | Freq: Once | SUBCUTANEOUS | Status: DC
Start: 2023-12-11 — End: 2023-12-17

## 2023-11-27 NOTE — Telephone Encounter (Signed)
 Prolia VOB initiated via [email or web address]  Next Prolia inj DUE: 12/25/23

## 2023-11-27 NOTE — Telephone Encounter (Signed)
 CAM placed.  Please check PA/Cost.

## 2023-11-29 ENCOUNTER — Ambulatory Visit: Admission: RE | Admit: 2023-11-29 | Payer: Medicare HMO | Source: Ambulatory Visit

## 2023-11-29 ENCOUNTER — Ambulatory Visit
Admission: RE | Admit: 2023-11-29 | Discharge: 2023-11-29 | Disposition: A | Discharge status: Home / Self Care | Payer: Medicare Other | Source: Ambulatory Visit | Attending: Internal Medicine | Admitting: Internal Medicine

## 2023-11-29 ENCOUNTER — Other Ambulatory Visit (HOSPITAL_COMMUNITY): Payer: Self-pay

## 2023-11-29 DIAGNOSIS — N6489 Other specified disorders of breast: Secondary | ICD-10-CM

## 2023-11-29 DIAGNOSIS — R928 Other abnormal and inconclusive findings on diagnostic imaging of breast: Secondary | ICD-10-CM | POA: Diagnosis not present

## 2023-11-29 NOTE — Telephone Encounter (Signed)
 Marland Kitchen

## 2023-11-29 NOTE — Telephone Encounter (Signed)
 Pt ready for scheduling for PROLIA  on or after : 12/25/23  Out-of-pocket cost due at time of visit: $343  Number of injection/visits approved: 2  Primary: [employer] MEDICARE Prolia  co-insurance: 20% Admin fee co-insurance: $20  Secondary: --- Prolia  co-insurance:  Admin fee co-insurance:   Medical Benefit Details: Date Benefits were checked: 11/29/23 Deductible: NO/ Coinsurance: 20%/ Admin Fee: $20  Prior Auth: APPROVED PA# [id number] Expiration Date: 11/29/23-11/28/24  # of doses approved:  Pharmacy benefit: Copay $550 If patient wants fill through the pharmacy benefit please send prescription to: [facility], and include estimated need by date in rx notes. Pharmacy will ship medication directly to the office.  Patient NOT eligible for Prolia  Copay Card. Copay Card can make patient's cost as little as $25. Link to apply: [email or web address]://[email or web address].[email or web address].[email or web address]/copay  ** This summary of benefits is an estimation of the patient's out-of-pocket cost. Exact cost may very based on individual plan coverage.

## 2023-12-17 MED ORDER — IBANDRONATE SODIUM 150 MG PO TABS: 150.0000 mg | ORAL_TABLET | ORAL | 3 refills | Status: DC

## 2023-12-17 NOTE — Addendum Note (Signed)
Addended by: Thelma Barge D on: 12/17/2023 12:24 PM   Modules accepted: Orders

## 2023-12-17 NOTE — Telephone Encounter (Signed)
(  Okay to take Boniva as long as GFR is more than 30) Advise patient: If she stops Prolia she will lose ground. Recommend to go back on Boniva 150 mg 1 tablet once a month.  Send the prescription the patient is agrees.  Be sure she does not have any problems swallowing and review the precautions on how to take Boniva.

## 2023-12-17 NOTE — Telephone Encounter (Signed)
Spoke with patient and she stated the price is to high this time.  She did get new insurance this year.  She declines to schedule at this time.  She will discuss this at next visit.

## 2023-12-17 NOTE — Telephone Encounter (Signed)
Patient agreed to take Boniva.  Rx sent into pharmacy.

## 2024-03-17 ENCOUNTER — Telehealth: Payer: Self-pay | Admitting: Internal Medicine

## 2024-03-17 NOTE — Telephone Encounter (Signed)
 Requesting:clorazepate  7.5mg   Contract: 04/30/23 UDS: 11/12/23 Last Visit: 11/12/23 Next Visit: 05/12/24 Last Refill: 11/12/23 #270 and 0RF   Please Advise

## 2024-03-17 NOTE — Telephone Encounter (Signed)
 PDMP okay, Rx sent

## 2024-04-15 ENCOUNTER — Other Ambulatory Visit: Payer: Self-pay | Admitting: Internal Medicine

## 2024-04-16 ENCOUNTER — Telehealth: Payer: Self-pay | Admitting: Internal Medicine

## 2024-04-16 NOTE — Telephone Encounter (Signed)
 Spoke w/ Pt- she called about her sister (sister is not a Pt of the clinic), she wanted advice on what to do. Her sister has been having balance issues and memory concerns. Informed Pt- I'm unable to give medical advice for a Pt who isn't legally in our care. I did recommend she call her sisters PCP office, informed she doesn't have one, recommended she take her to [facility] or ED. Kimberly Valenzuela states she will refuse to go. She thanked me for the help I could provide.

## 2024-04-16 NOTE — Telephone Encounter (Signed)
 Copied from CRM [id number]. Topic: General - Other >> Apr 16, 2024  9:12 AM Martinique E wrote: Reason for CRM: Patient called in specifically wanting to speak with PCP or nurse, did not say what it was in regards too. Just that she wants to discuss before her appointment in June. Callback number for patient is [phone].

## 2024-05-12 ENCOUNTER — Ambulatory Visit (INDEPENDENT_AMBULATORY_CARE_PROVIDER_SITE_OTHER): Payer: Medicare HMO | Admitting: Internal Medicine

## 2024-05-12 ENCOUNTER — Encounter: Payer: Self-pay | Admitting: Internal Medicine

## 2024-05-12 VITALS — BP 122/82 | HR 71 | Temp 98.2°F | Resp 16 | Ht 63.0 in | Wt 120.5 lb

## 2024-05-12 DIAGNOSIS — F419 Anxiety disorder, unspecified: Secondary | ICD-10-CM

## 2024-05-12 DIAGNOSIS — Z1231 Encounter for screening mammogram for malignant neoplasm of breast: Secondary | ICD-10-CM | POA: Diagnosis not present

## 2024-05-12 DIAGNOSIS — Z0000 Encounter for general adult medical examination without abnormal findings: Secondary | ICD-10-CM

## 2024-05-12 DIAGNOSIS — R634 Abnormal weight loss: Secondary | ICD-10-CM

## 2024-05-12 DIAGNOSIS — M810 Age-related osteoporosis without current pathological fracture: Secondary | ICD-10-CM | POA: Diagnosis not present

## 2024-05-12 DIAGNOSIS — Z0001 Encounter for general adult medical examination with abnormal findings: Secondary | ICD-10-CM | POA: Diagnosis not present

## 2024-05-12 DIAGNOSIS — I10 Essential (primary) hypertension: Secondary | ICD-10-CM

## 2024-05-12 DIAGNOSIS — Z79899 Other long term (current) drug therapy: Secondary | ICD-10-CM

## 2024-05-12 DIAGNOSIS — D649 Anemia, unspecified: Secondary | ICD-10-CM

## 2024-05-12 DIAGNOSIS — R739 Hyperglycemia, unspecified: Secondary | ICD-10-CM | POA: Diagnosis not present

## 2024-05-12 DIAGNOSIS — E785 Hyperlipidemia, unspecified: Secondary | ICD-10-CM | POA: Diagnosis not present

## 2024-05-12 DIAGNOSIS — N1832 Chronic kidney disease, stage 3b: Secondary | ICD-10-CM | POA: Diagnosis not present

## 2024-05-12 LAB — CBC WITH DIFFERENTIAL/PLATELET
Basophils Absolute: 0 10*3/uL (ref 0.0–0.1)
Basophils Relative: 0.6 % (ref 0.0–3.0)
Eosinophils Absolute: 0.3 10*3/uL (ref 0.0–0.7)
Eosinophils Relative: 3.7 % (ref 0.0–5.0)
HCT: 30.7 % — ABNORMAL LOW (ref 36.0–46.0)
Hemoglobin: 10.6 g/dL — ABNORMAL LOW (ref 12.0–15.0)
Lymphocytes Relative: 30.3 % (ref 12.0–46.0)
Lymphs Abs: 2.1 10*3/uL (ref 0.7–4.0)
MCHC: 34.5 g/dL (ref 30.0–36.0)
MCV: 95.6 fl (ref 78.0–100.0)
Monocytes Absolute: 0.4 10*3/uL (ref 0.1–1.0)
Monocytes Relative: 5.4 % (ref 3.0–12.0)
Neutro Abs: 4.2 10*3/uL (ref 1.4–7.7)
Neutrophils Relative %: 60 % (ref 43.0–77.0)
Platelets: 331 10*3/uL (ref 150.0–400.0)
RBC: 3.21 Mil/uL — ABNORMAL LOW (ref 3.87–5.11)
RDW: 13.7 % (ref 11.5–15.5)
WBC: 7.1 10*3/uL (ref 4.0–10.5)

## 2024-05-12 LAB — COMPREHENSIVE METABOLIC PANEL WITH GFR
ALT: 32 U/L (ref 0–35)
AST: 23 U/L (ref 0–37)
Albumin: 4.8 g/dL (ref 3.5–5.2)
Alkaline Phosphatase: 60 U/L (ref 39–117)
BUN: 23 mg/dL (ref 6–23)
CO2: 29 meq/L (ref 19–32)
Calcium: 10.8 mg/dL — ABNORMAL HIGH (ref 8.4–10.5)
Chloride: 102 meq/L (ref 96–112)
Creatinine, Ser: 1.62 mg/dL — ABNORMAL HIGH (ref 0.40–1.20)
GFR: 32.61 mL/min — ABNORMAL LOW (ref >60.00)
Glucose, Bld: 98 mg/dL (ref 70–99)
Potassium: 5 meq/L (ref 3.5–5.1)
Sodium: 138 meq/L (ref 135–145)
Total Bilirubin: 0.4 mg/dL (ref 0.2–1.2)
Total Protein: 6.8 g/dL (ref 6.0–8.3)

## 2024-05-12 LAB — LIPID PANEL
Cholesterol: 140 mg/dL (ref 0–200)
HDL: 51.5 mg/dL (ref >39.00)
LDL Cholesterol: 51 mg/dL (ref 0–99)
NonHDL: 88.35
Total CHOL/HDL Ratio: 3
Triglycerides: 186 mg/dL — ABNORMAL HIGH (ref 0.0–149.0)
VLDL: 37.2 mg/dL (ref 0.0–40.0)

## 2024-05-12 LAB — HEMOGLOBIN A1C: Hgb A1c MFr Bld: 5.5 % (ref 4.6–6.5)

## 2024-05-12 LAB — TSH: TSH: 1.57 u[IU]/mL (ref 0.35–5.50)

## 2024-05-12 LAB — VITAMIN D 25 HYDROXY (VIT D DEFICIENCY, FRACTURES): VITD: 66.11 ng/mL (ref 30.00–100.00)

## 2024-05-12 NOTE — Assessment & Plan Note (Signed)
 Here for CPX -Tdap 2022 - Shingrix  2020 -Vaccines are recommended: COVID booster if not done recently, flu shot every fall, -CCS: Cscope 04-2012--->  Polyps, Cscope 05/26/2018, next 10 years per colonoscopy report -Female care  Diagnostic mammogram 11/2023, next B MMG 05-2024 H/o abnormal PAP x 1 in the 90s, f/u by normal ones, last PAP 01/2019, declines further exams, no sxs  -Lung Ca screening: Next LDCT 08/2024 -Diet and exercise discussed -Tobacco abuse:   Still smoking, not ready to quit, info provided  -Labs:  CMP FLP CBC A1c TSH vitamin D  UDS

## 2024-05-12 NOTE — Progress Notes (Signed)
 Subjective:    Patient ID: Kimberly Valenzuela, female    DOB: 15-May-1956, 68 y.o.   MRN: [id number]  DOS:  05/12/2024 Type of visit - description: CPX  Here for CPX. In general feels well. Weight loss noted, states she has been much more active and also more stressed (her sister moved with her and she has some issues).  Diet is the same.  Feels well. Denies fever chills.  No night sweats.  Continues smoking, has occasional cough with mild sputum production.   Wt Readings from Last 3 Encounters:  05/12/24 120 lb 8 oz (54.7 kg)  11/12/23 135 lb (61.2 kg)  10/31/23 128 lb (58.1 kg)     Review of Systems See above   Past Medical History:  Diagnosis Date   Anxiety 08/03/2011   Back pain 2015   MRI, local injection, better after injection   Dyslipidemia    high TG , dx ~ 01-2011   Hypertension    Menopause    after BCP were d/c ~ 2010   Osteoporosis     Past Surgical History:  Procedure Laterality Date   CESAREAN SECTION     COLONOSCOPY     FACELIFT  11/2018   POLYPECTOMY     Social History   Social History Narrative   Lost parents 2014   Lost her husband ~ 01-2018   Sister lives w/ Kimberly             Current Outpatient Medications  Medication Instructions   aspirin EC 81 mg, Daily   atorvastatin  (LIPITOR) 20 mg, Oral, Daily at bedtime   clorazepate  (TRANXENE ) 7.5 mg, Oral, 3 times daily PRN   ibandronate  (BONIVA ) 150 mg, Oral, Every 30 days, Take in the morning with a full glass of water, on an empty stomach, and do not take anything else by mouth or lie down for the next 60 min.   lisinopril -hydrochlorothiazide  (ZESTORETIC ) 10-12.5 MG tablet 1 tablet, Oral, Daily   Multiple Vitamins-Calcium  (ONE-A-DAY WOMENS FORMULA) TABS 1 each, Daily   VITAJOY BIOTIN GUMMIES PO Take by mouth.   vitamin D3 2,000 Units, Daily       Objective:   Physical Exam  Skin:       BP 122/82   Pulse 71   Temp 98.2 F (36.8 C) (Oral)   Resp 16   Ht 5' 3 (1.6 m)   Wt 120 lb  8 oz (54.7 kg)   SpO2 98%   BMI 21.35 kg/m  General: Well developed, NAD, BMI noted Neck: No  thyromegaly  HEENT:  Normocephalic . Face symmetric, atraumatic Lungs:  CTA B Normal respiratory effort, no intercostal retractions, no accessory muscle use. Heart: RRR,  no murmur.  Abdomen:  Not distended, soft, non-tender. No rebound or rigidity.   Lower extremities: no pretibial edema bilaterally  Skin: Exposed areas without rash. Not pale. Not jaundice Neurologic:  alert & oriented X3.  Speech normal, gait appropriate for age and unassisted Strength symmetric and appropriate for age.  Psych: Cognition and judgment appear intact.  Cooperative with normal attention span and concentration.  Behavior appropriate. No anxious or depressed appearing.     Assessment    Assessment HTN CKD : Baseline 1.2 >>> 1.59 (06-2015), wnl renal US  (09/2015, 03/2022) Dyslipidemia Anxiety - tranxene   Back pain, 2015, had a MRI, improve after a local injection H/o iron deficiency anemia  Osteoporosis  --T score -2.8 (2013)-- rx started Boniva  2014 --T score -3.4 (03-2016)  worse ; rx  PROLIA  #3 07-03-17.   --05/15/2023, T-score -3.0, improving.  On Prolia  (switch back on Boniva  11/2023, cost) Lipoma R upper back +FH ovarian cancer, GM and mother. Had genetic testing  2014 ---> negative Coronary calcifications per CT >>> stress test-2022: Prior infarct, low risk.  Plan is CV RF control COPD-emphysema: Per CT   PLAN: Here for CPX -Tdap 2022 - Shingrix  2020 -Vaccines are recommended: COVID booster if not done recently, flu shot every fall, -CCS: Cscope 04-2012--->  Polyps, Cscope 05/26/2018, next 10 years per colonoscopy report -Female care  Diagnostic mammogram 11/2023, next B MMG 05-2024 H/o abnormal PAP x 1 in the 90s, f/u by normal ones, last PAP 01/2019, declines further exams, no sxs  -Lung Ca screening: Next LDCT 08/2024 -Diet and exercise discussed -Tobacco abuse:   Still smoking, not ready to  quit, info provided  -Labs:  CMP FLP CBC A1c TSH vitamin D  UDS  Other issues addressed HTN: BP looks good, recommend to check at home from time to count, continue Zestoretic . CKD: Recommend to avoid NSAIDs, drink plenty of fluids, checking labs. Dyslipidemia: On atorvastatin , check FLP. Anxiety: On Tranxene , uses daily, occasionally twice daily, increased stress lately. Anemia: No GI symptoms, UTD on colonoscopies.  Checking labs. Osteoporosis, Was on Prolia , due to cost, coverage was switched to Boniva  12/17/2023. COPD: Minimal symptoms Smoking: Not ready to quit.  Sees the dentist regularly Lipoma: At the right upper back, has not changed in years per patient.  Observation. Weight loss: States is more active lately, she also thinks weight loss related to recent increase in stress.   She feels well, does not look sick, checking general labs, reassess in 4 months RTC 4 months (to reassess weight loss), declined, will come back in 6 months

## 2024-05-12 NOTE — Patient Instructions (Signed)
 You are due for mammogram next month.  Vaccines I recommend: Flu shot every fall COVID booster    Check the  blood pressure regularly Blood pressure goal:  between 110/65 and  135/85. If it is consistently higher or lower, let me know     GO TO THE LAB :  Get the blood work   Your results will be posted on MyChart with my comments  Next office visit for a checkup in 4 months Please make an appointment before you leave today    STOP BY THE FIRST FLOOR:  get the XR

## 2024-05-12 NOTE — Assessment & Plan Note (Signed)
 Here for CPX   Other issues addressed HTN: BP looks good, recommend to check at home from time to count, continue Zestoretic . CKD: Recommend to avoid NSAIDs, drink plenty of fluids, checking labs. Dyslipidemia: On atorvastatin , check FLP. Anxiety: On Tranxene , uses daily, occasionally twice daily, increased stress lately. Anemia: No GI symptoms, UTD on colonoscopies.  Checking labs. Osteoporosis, Was on Prolia , due to cost, coverage was switched to Boniva  12/17/2023. COPD: Minimal symptoms Smoking: Not ready to quit.  Sees the dentist regularly Lipoma: At the right upper back, has not changed in years per patient.  Observation. Weight loss: States is more active lately, she also thinks weight loss related to recent increase in stress.   She feels well, does not look sick, checking general labs, reassess in 4 months RTC 4 months (to reassess weight loss), declined, will come back in 6 months

## 2024-05-13 ENCOUNTER — Ambulatory Visit: Payer: Self-pay | Admitting: Internal Medicine

## 2024-05-13 DIAGNOSIS — N289 Disorder of kidney and ureter, unspecified: Secondary | ICD-10-CM

## 2024-05-14 LAB — DRUG MONITORING PANEL 375977 , URINE
Alcohol Metabolites: NEGATIVE ng/mL (ref <500)
Alphahydroxyalprazolam: NEGATIVE ng/mL (ref <25)
Alphahydroxymidazolam: NEGATIVE ng/mL (ref <50)
Alphahydroxytriazolam: NEGATIVE ng/mL (ref <50)
Aminoclonazepam: NEGATIVE ng/mL (ref <25)
Amphetamines: NEGATIVE ng/mL (ref <500)
Barbiturates: NEGATIVE ng/mL (ref <300)
Benzodiazepines: POSITIVE ng/mL — AB (ref <100)
Cocaine Metabolite: NEGATIVE ng/mL (ref <150)
Desmethyltramadol: NEGATIVE ng/mL (ref <100)
Hydroxyethylflurazepam: NEGATIVE ng/mL (ref <50)
Lorazepam: NEGATIVE ng/mL (ref <50)
Marijuana Metabolite: 104 ng/mL — ABNORMAL HIGH (ref <5)
Marijuana Metabolite: POSITIVE ng/mL — AB (ref <20)
Nordiazepam: NEGATIVE ng/mL (ref <50)
Opiates: NEGATIVE ng/mL (ref <100)
Oxazepam: 2713 ng/mL — ABNORMAL HIGH (ref <50)
Oxycodone: NEGATIVE ng/mL (ref <100)
Temazepam: NEGATIVE ng/mL (ref <50)
Tramadol: NEGATIVE ng/mL (ref <100)

## 2024-05-14 LAB — DM TEMPLATE

## 2024-05-18 ENCOUNTER — Other Ambulatory Visit (INDEPENDENT_AMBULATORY_CARE_PROVIDER_SITE_OTHER)

## 2024-05-18 DIAGNOSIS — N289 Disorder of kidney and ureter, unspecified: Secondary | ICD-10-CM

## 2024-05-18 LAB — URINALYSIS, ROUTINE W REFLEX MICROSCOPIC
Bilirubin Urine: NEGATIVE
Hgb urine dipstick: NEGATIVE
Ketones, ur: NEGATIVE
Leukocytes,Ua: NEGATIVE
Nitrite: NEGATIVE
RBC / HPF: NONE SEEN (ref 0–?)
Specific Gravity, Urine: <=1.005 — AB (ref 1.000–1.030)
Total Protein, Urine: NEGATIVE
Urine Glucose: NEGATIVE
Urobilinogen, UA: 0.2 (ref 0.0–1.0)
WBC, UA: NONE SEEN (ref 0–?)
pH: 7.5 (ref 5.0–8.0)

## 2024-05-18 LAB — MICROALBUMIN / CREATININE URINE RATIO
Creatinine,U: 45.4 mg/dL
Microalb Creat Ratio: UNDETERMINED mg/g (ref 0.0–30.0)
Microalb, Ur: <0.7 mg/dL

## 2024-05-19 ENCOUNTER — Ambulatory Visit: Payer: Self-pay | Admitting: Internal Medicine

## 2024-06-16 NOTE — Progress Notes (Signed)
 This encounter was created in error - please disregard.

## 2024-07-30 ENCOUNTER — Telehealth: Payer: Self-pay

## 2024-07-30 NOTE — Telephone Encounter (Signed)
 Copied from CRM [id number]. Topic: Clinical - Prescription Issue >> Jul 30, 2024 10:20 AM Burnard DEL wrote: Reason for CRM: Pharmacy called stating that medication clorazepate  (TRANXENE ) 7.5 MG tablet  is on back order and patient doesn't want to travel to any other pharmacy.They would like to know if 15 mg could be prescribed for patient for her to take half of tablet?  [id number] - [place], [place] - [address] AT [facility] OF [address]  Phone: [phone] Fax: [phone]

## 2024-07-30 NOTE — Telephone Encounter (Signed)
**Note De-identified  [place] Obfuscation** Please advise 

## 2024-07-31 ENCOUNTER — Other Ambulatory Visit: Payer: Self-pay | Admitting: Family

## 2024-07-31 MED ORDER — CLORAZEPATE DIPOTASSIUM 15 MG PO TABS: 7.5000 mg | ORAL_TABLET | Freq: Three times a day (TID) | ORAL | 1 refills | Status: DC | PRN

## 2024-07-31 NOTE — Telephone Encounter (Signed)
 Spoke w/ Pt- informed that Rx has been sent.

## 2024-07-31 NOTE — Telephone Encounter (Signed)
 Kimberly Valenzuela   07/31/2024  8:04 AM  Patient was calling to follow up on medication, advised patient of message from pharmacy to office so patient doesn't have to travel. States she will wait to see what Dr. Amon said about giving the 15mg  and have patient cut the pills in half she's okay with cutting them, just wants to know an update since she has been out of the medication.   Kimberly Valenzuela [phone]

## 2024-07-31 NOTE — Telephone Encounter (Signed)
 Can you refill the clorazepate  in PCP absence? She is okay w/ cutting tablets.

## 2024-08-03 ENCOUNTER — Ambulatory Visit
Admission: RE | Admit: 2024-08-03 | Discharge: 2024-08-03 | Disposition: A | Discharge status: Home / Self Care | Source: Ambulatory Visit | Attending: Internal Medicine | Admitting: Internal Medicine

## 2024-08-03 DIAGNOSIS — Z1231 Encounter for screening mammogram for malignant neoplasm of breast: Secondary | ICD-10-CM | POA: Diagnosis not present

## 2024-09-07 ENCOUNTER — Ambulatory Visit (HOSPITAL_BASED_OUTPATIENT_CLINIC_OR_DEPARTMENT_OTHER)
Admission: RE | Admit: 2024-09-07 | Discharge: 2024-09-07 | Disposition: A | Discharge status: Home / Self Care | Source: Ambulatory Visit | Attending: Acute Care | Admitting: Acute Care

## 2024-09-07 DIAGNOSIS — Z122 Encounter for screening for malignant neoplasm of respiratory organs: Secondary | ICD-10-CM | POA: Insufficient documentation

## 2024-09-07 DIAGNOSIS — Z87891 Personal history of nicotine dependence: Secondary | ICD-10-CM | POA: Insufficient documentation

## 2024-09-07 DIAGNOSIS — F17210 Nicotine dependence, cigarettes, uncomplicated: Secondary | ICD-10-CM | POA: Diagnosis not present

## 2024-09-11 ENCOUNTER — Other Ambulatory Visit: Payer: Self-pay

## 2024-09-11 DIAGNOSIS — Z87891 Personal history of nicotine dependence: Secondary | ICD-10-CM

## 2024-09-11 DIAGNOSIS — F17210 Nicotine dependence, cigarettes, uncomplicated: Secondary | ICD-10-CM

## 2024-09-11 DIAGNOSIS — Z122 Encounter for screening for malignant neoplasm of respiratory organs: Secondary | ICD-10-CM

## 2024-09-18 ENCOUNTER — Telehealth: Payer: Self-pay | Admitting: Internal Medicine

## 2024-09-18 ENCOUNTER — Other Ambulatory Visit: Payer: Self-pay | Admitting: Internal Medicine

## 2024-09-18 NOTE — Telephone Encounter (Signed)
  Second attempt to send CRM. Medication, clorazepate , pended.  Correct pharmacy included  CRM # [id number] Owner: Vicci Ellouise HERO, RN Status: Resolved Open  Priority: Routine Created on: 09/18/2024 02:47 PM By: Howie Wess HERO   Primary Information  Source  [place], Kimberly Valenzuela (Patient)   Subject  [name], Kimberly Valenzuela (Patient)   Topic  Clinical - Prescription Issue    Communication  Reason for CRM: Patient would like a prescription for clorazepate  (TRANXENE ) 7.5MG  tablet sent to the [facility]. She stated they are the only one that has it. They do not have the 15 MG.        Callback #: [id number]        Pharmacy:    [facility]    [address] [place] [address]    Phone: [phone] Fax: [phone]    Hours: Mon-Fri 7:30a-7p; Sat 8a-4:30p; [place] 10a-2p

## 2024-09-18 NOTE — Telephone Encounter (Signed)
 There is nothing in the note?

## 2024-09-19 ENCOUNTER — Other Ambulatory Visit (HOSPITAL_COMMUNITY): Payer: Self-pay

## 2024-09-19 MED ORDER — CLORAZEPATE DIPOTASSIUM 15 MG PO TABS
7.5000 mg | ORAL_TABLET | Freq: Three times a day (TID) | ORAL | 1 refills | Status: DC | PRN
  Filled 2024-09-19: qty 45, 30d supply, fill #0

## 2024-09-20 ENCOUNTER — Other Ambulatory Visit (HOSPITAL_COMMUNITY): Payer: Self-pay

## 2024-09-21 ENCOUNTER — Other Ambulatory Visit (HOSPITAL_COMMUNITY): Payer: Self-pay

## 2024-09-22 ENCOUNTER — Other Ambulatory Visit (HOSPITAL_COMMUNITY): Payer: Self-pay

## 2024-09-30 ENCOUNTER — Telehealth: Payer: Self-pay

## 2024-09-30 ENCOUNTER — Other Ambulatory Visit (HOSPITAL_COMMUNITY): Payer: Self-pay

## 2024-09-30 MED ORDER — CLORAZEPATE DIPOTASSIUM 7.5 MG PO TABS: ORAL_TABLET | ORAL | 1 refills | Status: DC

## 2024-09-30 NOTE — Telephone Encounter (Signed)
Please advise in PCP absence.  

## 2024-09-30 NOTE — Telephone Encounter (Signed)
 Copied from CRM [id number]. Topic: Clinical - Prescription Issue >> Sep 30, 2024  8:46 AM Rea ORN wrote: Reason for CRM: Jama with [facility] called about clorazepate  (TRANXENE ) 15 MG tablet. There is only one supplier that is making this medication. The 7.5 mg tab is the only dose that is in stock. 15 mg is no longer in stock. They need a new script for 7.5 mg. Pt is currently out of meds and has been out for 3 days.  Jama is asking for this rx to be sent as soon as possible.   Please call [phone] with any additional questions. >> Sep 30, 2024 10:16 AM China J wrote: The patient is calling back to see if the clorazepate  (TRANXENE ) 7.5 MG tablet could be sent to [facility]. She has been without medication for over 3 weeks.

## 2024-09-30 NOTE — Addendum Note (Signed)
 Addended by: WATT RAISIN C on: 09/30/2024 12:22 PM   Modules accepted: Orders

## 2024-10-10 ENCOUNTER — Other Ambulatory Visit: Payer: Self-pay | Admitting: Internal Medicine

## 2024-11-13 ENCOUNTER — Ambulatory Visit: Admitting: Internal Medicine

## 2024-11-13 ENCOUNTER — Encounter: Payer: Self-pay | Admitting: Internal Medicine

## 2024-11-13 VITALS — BP 130/74 | HR 78 | Ht 63.0 in | Wt 119.0 lb

## 2024-11-13 DIAGNOSIS — F439 Reaction to severe stress, unspecified: Secondary | ICD-10-CM | POA: Diagnosis not present

## 2024-11-13 DIAGNOSIS — I10 Essential (primary) hypertension: Secondary | ICD-10-CM

## 2024-11-13 DIAGNOSIS — N1832 Chronic kidney disease, stage 3b: Secondary | ICD-10-CM | POA: Diagnosis not present

## 2024-11-13 DIAGNOSIS — D649 Anemia, unspecified: Secondary | ICD-10-CM

## 2024-11-13 DIAGNOSIS — F419 Anxiety disorder, unspecified: Secondary | ICD-10-CM

## 2024-11-13 LAB — CBC WITH DIFFERENTIAL/PLATELET
Basophils Absolute: 0.1 K/uL (ref 0.0–0.1)
Basophils Relative: 0.6 % (ref 0.0–3.0)
Eosinophils Absolute: 0.3 K/uL (ref 0.0–0.7)
Eosinophils Relative: 4.1 % (ref 0.0–5.0)
HCT: 29.7 % — ABNORMAL LOW (ref 36.0–46.0)
Hemoglobin: 10.3 g/dL — ABNORMAL LOW (ref 12.0–15.0)
Lymphocytes Relative: 33.7 % (ref 12.0–46.0)
Lymphs Abs: 2.7 K/uL (ref 0.7–4.0)
MCHC: 34.9 g/dL (ref 30.0–36.0)
MCV: 94.8 fl (ref 78.0–100.0)
Monocytes Absolute: 0.4 K/uL (ref 0.1–1.0)
Monocytes Relative: 4.8 % (ref 3.0–12.0)
Neutro Abs: 4.5 K/uL (ref 1.4–7.7)
Neutrophils Relative %: 56.8 % (ref 43.0–77.0)
Platelets: 271 K/uL (ref 150.0–400.0)
RBC: 3.13 Mil/uL — ABNORMAL LOW (ref 3.87–5.11)
RDW: 13.6 % (ref 11.5–15.5)
WBC: 8 K/uL (ref 4.0–10.5)

## 2024-11-13 LAB — BASIC METABOLIC PANEL WITH GFR
BUN: 25 mg/dL — ABNORMAL HIGH (ref 6–23)
CO2: 26 meq/L (ref 19–32)
Calcium: 10.2 mg/dL (ref 8.4–10.5)
Chloride: 102 meq/L (ref 96–112)
Creatinine, Ser: 1.74 mg/dL — ABNORMAL HIGH (ref 0.40–1.20)
GFR: 29.83 mL/min — ABNORMAL LOW
Glucose, Bld: 92 mg/dL (ref 70–99)
Potassium: 4.9 meq/L (ref 3.5–5.1)
Sodium: 138 meq/L (ref 135–145)

## 2024-11-13 MED ORDER — CLORAZEPATE DIPOTASSIUM 7.5 MG PO TABS: ORAL_TABLET | ORAL | 1 refills | Status: AC

## 2024-11-13 NOTE — Progress Notes (Addendum)
 "  Subjective:    Patient ID: Kimberly Valenzuela, female    DOB: 1956-07-01, 68 y.o.   MRN: [id number]  DOS:  11/13/2024 Follow-up  Discussed the use of AI scribe software for clinical note transcription with the patient, who gave verbal consent to proceed.  History of Present Illness Kimberly Valenzuela is a 68 year old female who presents for a follow-up visit.  Hypertension management - Not monitoring blood pressure at home  RXI:Mzrjood prior recommendation to see nephrology for blood pressure management but has not scheduled an appointment    Anxiety symptoms and management - Takes clorazepate  7.5 mg daily, occasionally twice daily - Previous pharmacy shortage of clorazepate  increased anxiety - Major stressor from sister's death in 2025-05-26 due to complications of untreated illness  Osteoporosis management - Takes monthly Boniva  and vitamin D   Hyperlipidemia management - Takes atorvastatin   Vaccination status and preventive care - Has not received a flu shot this year - Hesitant about vaccinations  Gastrointestinal and constitutional symptoms - No nausea, vomiting, diarrhea, blood in stool, or fatigue - Good appetite  Alcohol use - Markedly reduced alcohol intake - Currently consumes about one margarita per month   Wt Readings from Last 3 Encounters:  11/13/24 119 lb (54 kg)  05/12/24 120 lb 8 oz (54.7 kg)  11/12/23 135 lb (61.2 kg)   Review of Systems See above   Past Medical History:  Diagnosis Date   Anxiety 08/03/2011   Back pain 2015   MRI, local injection, better after injection   Dyslipidemia    high TG , dx ~ 01-2011   Hypertension    Menopause    after BCP were d/c ~ 2010   Osteoporosis     Past Surgical History:  Procedure Laterality Date   CESAREAN SECTION     COLONOSCOPY     FACELIFT  11/2018   POLYPECTOMY      Current Outpatient Medications  Medication Instructions   aspirin EC 81 mg, Daily   atorvastatin  (LIPITOR) 20 MG tablet TAKE 1 TABLET(20  MG) BY MOUTH AT BEDTIME   clorazepate  (TRANXENE ) 7.5 MG tablet TAKE 1 TABLET BY MOUTH THREE TIMES DAILY AS NEEDED   ibandronate  (BONIVA ) 150 mg, Oral, Every 30 days, Take in the morning with a full glass of water, on an empty stomach, and do not take anything else by mouth or lie down for the next 60 min.   lisinopril -hydrochlorothiazide  (ZESTORETIC ) 10-12.5 MG tablet 1 tablet, Oral, Daily   Multiple Vitamins-Calcium  (ONE-A-DAY WOMENS FORMULA) TABS 1 each, Daily   VITAJOY BIOTIN GUMMIES PO Take by mouth.   vitamin D3 2,000 Units, Daily       Objective:   Physical Exam BP 130/74 (BP Location: Left Arm, Patient Position: Sitting, Cuff Size: Normal)   Pulse 78   Ht 5' 3 (1.6 m)   Wt 119 lb (54 kg)   SpO2 100%   BMI 21.08 kg/m  General:   Well developed, NAD, BMI noted. HEENT:  Normocephalic . Face symmetric, atraumatic Lower extremities: no pretibial edema bilaterally  Skin: Not pale. Not jaundice Neurologic:  alert & oriented X3.  Speech normal, gait appropriate for age and unassisted Psych--  Cognition and judgment appear intact.  Cooperative with normal attention span and concentration.  Behavior appropriate. No anxious or depressed appearing.      Assessment    Assessment HTN CKD : Baseline 1.2 >>> 1.59 (06-2015), wnl renal US  (09/2015, 03/2022) Dyslipidemia Anxiety - tranxene   Back pain, 2015,  had a MRI, improve after a local injection H/o iron deficiency anemia  Osteoporosis  --T score -2.8 (2013)-- rx started Boniva  2014 --T score -3.4 (03-2016)  worse ; rx  PROLIA  #3 07-03-17.   --05/15/2023, T-score -3.0, improving.  On Prolia  (switch back on Boniva  11/2023, cost) Lipoma R upper back +FH ovarian cancer, GM and mother. Had genetic testing  2014 ---> negative Coronary calcifications per CT >>> stress test-2022: Prior infarct, low risk.  Plan is CV RF control COPD-emphysema: Per CT    Assessment & Plan CKD stage 3b: Last GFR 32, etiology unclear,  nephrosclerosis?. She declined at a nephrology referral, explained the importance of seeing specialist.  She remains reluctant but at the end she agreed to do blood work and if I recommend a referral she will pursue.  Plan: BMP  HTN: On Zestoretic , no ambulatory BPs, recommend to start checking, goals provide.  Checking a BMP Anxiety, chronic, on Tranxene , PDMP okay, prescription sent. Anemia: Chronic, stable, not iron deficient.  No GI symptoms.  Check CBC.  Could be related to CKD Stress: Related to the death of her sister few months ago.  Listening therapy provided. Tobacco: Ongoing, not ready to quit EtOH: Has almost stopped alcohol completely General Health Maintenance Has consistently declined any vaccines and declines again today RTC 6 months CPX  Time spent 30 minutes.  More than 50% of the time counseling about CKD and providing listening therapy regards stress. "

## 2024-11-13 NOTE — Assessment & Plan Note (Addendum)
 CKD stage 3b: Last GFR 32, etiology unclear, nephrosclerosis?. She declined at a nephrology referral, explained the importance of seeing specialist.  She remains reluctant but at the end she agreed to do blood work and if I recommend a referral she will pursue.  Plan: BMP  HTN: On Zestoretic , no ambulatory BPs, recommend to start checking, goals provide.  Checking a BMP Anxiety, chronic, on Tranxene , PDMP okay, prescription sent. Anemia: Chronic, stable, not iron deficient.  No GI symptoms.  Check CBC.  Could be related to CKD Stress: Related to the death of her sister few months ago.  Listening therapy provided. Tobacco: Ongoing, not ready to quit EtOH: Has almost stopped alcohol completely General Health Maintenance Has consistently declined any vaccines and declines again today RTC 6 months CPX

## 2024-11-13 NOTE — Patient Instructions (Addendum)
 GO TO THE LAB :  Get the blood work    Then, go to the front desk for the checkout Please make an appointment for a physical exam in 6 months       Recommend to start checking your blood pressure regularly Blood pressure goal:  between 110/65 and  135/85. If it is consistently higher or lower, let me know      HYPERTENSION: You are not currently monitoring your blood pressure at home and have not scheduled an appointment with a nephrologist as previously recommended. -Please start monitoring your blood pressure at home regularly. - After the blood work is possible I will recommend you to see a kidney doctor because your kidney is not working at full capacity.  ANXIETY: You are taking clorazepate  for anxiety and have experienced increased anxiety due to a pharmacy shortage and the recent death of your sister. -Continue taking clorazepate  7.5 mg daily, and you may take it twice daily if needed.    OSTEOPOROSIS: You are managing your osteoporosis with Boniva  and vitamin D . -Continue taking Boniva  monthly and vitamin D  as prescribed.  HYPERLIPIDEMIA: You are taking atorvastatin  to manage your cholesterol levels. -Continue taking atorvastatin  as prescribed.  CHRONIC KIDNEY DISEASE: Your kidney function is abnormal, and the cause is unclear. -A Basic Metabolic Panel (BMP) has been ordered to assess your kidney function.

## 2024-11-15 ENCOUNTER — Ambulatory Visit: Payer: Self-pay | Admitting: Internal Medicine

## 2024-12-02 ENCOUNTER — Ambulatory Visit

## 2024-12-02 VITALS — Ht 63.0 in | Wt 119.0 lb

## 2024-12-02 DIAGNOSIS — M810 Age-related osteoporosis without current pathological fracture: Secondary | ICD-10-CM

## 2024-12-02 DIAGNOSIS — Z1231 Encounter for screening mammogram for malignant neoplasm of breast: Secondary | ICD-10-CM

## 2024-12-02 DIAGNOSIS — Z0000 Encounter for general adult medical examination without abnormal findings: Secondary | ICD-10-CM | POA: Diagnosis not present

## 2024-12-02 NOTE — Progress Notes (Signed)
 "  Chief Complaint  Patient presents with   Medicare Wellness     Subjective:   Kimberly Valenzuela is a 69 y.o. female who presents for a [employer] Visit.  Visit info / Clinical Intake: Medicare Wellness Visit Type:: Subsequent Annual Wellness Visit Persons participating in visit and providing information:: patient Medicare Wellness Visit Mode:: Telephone If telephone:: video declined Since this visit was completed virtually, some vitals may be partially provided or unavailable. Missing vitals are due to the limitations of the virtual format.: Unable to obtain vitals - no equipment If Telephone or Video please confirm:: I connected with patient using audio/video enable telemedicine. I verified patient identity with two identifiers, discussed telehealth limitations, and patient agreed to proceed. Patient Location:: home Provider Location:: office Interpreter Needed?: No Pre-visit prep was completed: yes AWV questionnaire completed by patient prior to visit?: yes Date:: 11/13/25 Living arrangements:: (!) lives alone Patient's Overall Health Status Rating: good Typical amount of pain: none Does pain affect daily life?: no Are you currently prescribed opioids?: no  Dietary Habits and Nutritional Risks How many meals a day?: (!) 1 (1-2 meals, mainly supper sometimes has a snack during lunch) Eats fruit and vegetables daily?: yes Most meals are obtained by: preparing own meals In the last 2 weeks, have you had any of the following?: none Diabetic:: no  Functional Status Activities of Daily Living (to include ambulation/medication): Independent Ambulation: Independent Medication Administration: Independent Home Management (perform basic housework or laundry): Independent Manage your own finances?: yes Primary transportation is: driving Concerns about vision?: no *vision screening is required for WTM* (past due with [facility] and will call to schedule) Concerns about  hearing?: no  Fall Screening Falls in the past year?: 0 Number of falls in past year: 0 Was there an injury with Fall?: 0 Fall Risk Category Calculator: 0 Patient Fall Risk Level: Low Fall Risk  Fall Risk Patient at Risk for Falls Due to: No Fall Risks Fall risk Follow up: Falls evaluation completed  Home and Transportation Safety: All rugs have non-skid backing?: yes All stairs or steps have railings?: yes (only has 1 step up into the house) Grab bars in the bathtub or shower?: (!) no (doesn't need at present) Have non-skid surface in bathtub or shower?: yes Good home lighting?: yes Regular seat belt use?: yes Hospital stays in the last year:: no  Cognitive Assessment Difficulty concentrating, remembering, or making decisions? : no Will 6CIT or Mini Cog be Completed: yes What year is it?: 0 points What month is it?: 0 points Give patient an address phrase to remember (5 components): [address] [place], [place]  About what time is it?: 0 points Count backwards from 20 to 1: 0 points Say the months of the year in reverse: 0 points Repeat the address phrase from earlier: 0 points 6 CIT Score: 0 points  Advance Directives (For Healthcare) Does Patient Have a Medical Advance Directive?: No Would patient like information on creating a medical advance directive?: Yes (MAU/Ambulatory/Procedural Areas - Information given)  Reviewed/Updated  Reviewed/Updated: Reviewed All (Medical, Surgical, Family, Medications, Allergies, Care Teams, Patient Goals)    Allergies (verified) Patient has no known allergies.   Current Medications (verified) Outpatient Encounter Medications as of 12/02/2024  Medication Sig   aspirin 81 MG EC tablet Take 81 mg by mouth daily. Swallow whole.   atorvastatin  (LIPITOR) 20 MG tablet TAKE 1 TABLET(20 MG) BY MOUTH AT BEDTIME   Cholecalciferol (VITAMIN D3) 50 MCG (2000 UT) CAPS Take 2,000  Units by mouth daily.   clorazepate  (TRANXENE ) 7.5 MG  tablet TAKE 1 TABLET BY MOUTH THREE TIMES DAILY AS NEEDED   ibandronate  (BONIVA ) 150 MG tablet Take 1 tablet (150 mg total) by mouth every 30 (thirty) days. Take in the morning with a full glass of water, on an empty stomach, and do not take anything else by mouth or lie down for the next 60 min.   lisinopril -hydrochlorothiazide  (ZESTORETIC ) 10-12.5 MG tablet TAKE 1 TABLET BY MOUTH DAILY   Multiple Vitamins-Calcium  (ONE-A-DAY WOMENS FORMULA) TABS Take 1 each by mouth daily. Womens Menopause Formula   VITAJOY BIOTIN GUMMIES PO Take by mouth.   No facility-administered encounter medications on file as of 12/02/2024.    History: Past Medical History:  Diagnosis Date   Anxiety 08/03/2011   Back pain 2015   MRI, local injection, better after injection   Dyslipidemia    high TG , dx ~ 01-2011   Hypertension    Menopause    after BCP were d/c ~ 2010   Osteoporosis    Past Surgical History:  Procedure Laterality Date   CESAREAN SECTION     COLONOSCOPY     FACELIFT  11/2018   POLYPECTOMY     Family History  Problem Relation Age of Onset   Ovarian cancer Mother 28   Lung cancer Maternal Uncle        smoker   Colon cancer Maternal Grandmother 63       dx in her 75s   Renal cancer Maternal Grandfather        dx in his 26s   Diabetes Neg Hx    Coronary artery disease Neg Hx    Stroke Neg Hx    Breast cancer Neg Hx    Esophageal cancer Neg Hx    Liver cancer Neg Hx    Pancreatic cancer Neg Hx    Rectal cancer Neg Hx    Stomach cancer Neg Hx    Social History   Occupational History   Occupation: retired/ airline pilot   Tobacco Use   Smoking status: Every Day    Current packs/day: 1.00    Average packs/day: 1 pack/day for 40.0 years (40.0 ttl pk-yrs)    Types: Cigarettes   Smokeless tobacco: Never   Tobacco comments:    ~ 1/2 ppd  Vaping Use   Vaping status: Never Used  Substance and Sexual Activity   Alcohol use: Yes    Comment: very seldom   Drug use: Yes    Frequency: 4.0  times per week    Types: Marijuana    Comment: last smoked yesterday marijuana   Sexual activity: Not Currently    Birth control/protection: Post-menopausal   Tobacco Counseling Ready to quit: Not Answered Counseling given: Not Answered Tobacco comments: ~ 1/2 ppd  SDOH Screenings   Food Insecurity: No Food Insecurity (12/02/2024)  Housing: Low Risk (12/02/2024)  Transportation Needs: No Transportation Needs (12/02/2024)  Utilities: Not At Risk (12/02/2024)  Alcohol Screen: Low Risk (10/31/2023)  Depression (PHQ2-9): Low Risk (12/02/2024)  Financial Resource Strain: Low Risk (10/31/2023)  Physical Activity: Insufficiently Active (12/02/2024)  Social Connections: Socially Isolated (12/02/2024)  Stress: No Stress Concern Present (12/02/2024)  Tobacco Use: High Risk (12/02/2024)  Health Literacy: Adequate Health Literacy (10/31/2023)   See flowsheets for full screening details  Depression Screen PHQ 2 & 9 Depression Scale- Over the past 2 weeks, how often have you been bothered by any of the following problems? Little interest or pleasure in doing  things: 0 Feeling down, depressed, or hopeless (PHQ Adolescent also includes...irritable): 0 PHQ-2 Total Score: 0 Trouble falling or staying asleep, or sleeping too much: 0 Feeling tired or having little energy: 0 Poor appetite or overeating (PHQ Adolescent also includes...weight loss): 0 Feeling bad about yourself - or that you are a failure or have let yourself or your family down: 0 Trouble concentrating on things, such as reading the newspaper or watching television (PHQ Adolescent also includes...like school work): 0 Moving or speaking so slowly that other people could have noticed. Or the opposite - being so fidgety or restless that you have been moving around a lot more than usual: 0 Thoughts that you would be better off dead, or of hurting yourself in some way: 0 PHQ-9 Total Score: 0 If you checked off any problems, how difficult have  these problems made it for you to do your work, take care of things at home, or get along with other people?: Not difficult at all     Goals Addressed             This Visit's Progress    To stop smoking               Objective:    Today's Vitals   12/02/24 0944  Weight: 119 lb (54 kg)  Height: 5' 3 (1.6 m)   Body mass index is 21.08 kg/m.  Hearing/Vision screen No results found. Immunizations and Health Maintenance Health Maintenance  Topic Date Due   Influenza Vaccine  02/16/2025 (Originally 06/19/2024)   Mammogram  08/03/2025   Lung Cancer Screening  09/07/2025   Medicare Annual Wellness (AWV)  12/02/2025   Colonoscopy  05/26/2028   DTaP/Tdap/Td (3 - Td or Tdap) 03/25/2031   Bone Density Scan  Completed   Hepatitis C Screening  Completed   Zoster Vaccines- Shingrix   Completed   Meningococcal B Vaccine  Aged Out   Pneumococcal Vaccine: 50+ Years  Discontinued   COVID-19 Vaccine  Discontinued        Assessment/Plan:  This is a routine wellness examination for [place].  Patient Care Team: Amon Aloysius BRAVO, MD as PCP - General (Internal Medicine) Barbra Lang PARAS, DO as Attending Physician (Obstetrics and Gynecology) Avram Lupita BRAVO, MD as Consulting Physician (Gastroenterology) [facility], [facility], [facility])  I have personally reviewed and noted the following in the patients chart:   Medical and social history Use of alcohol, tobacco or illicit drugs  Current medications and supplements including opioid prescriptions. Functional ability and status Nutritional status Physical activity Advanced directives List of other physicians Hospitalizations, surgeries, and ER visits in previous 12 months Vitals Screenings to include cognitive, depression, and falls Referrals and appointments  Orders Placed This Encounter  Procedures   MM 3D SCREENING MAMMOGRAM BILATERAL BREAST    Standing Status:   Future    Expected Date:    08/03/2025    Expiration Date:   12/02/2025    Reason for Exam (SYMPTOM  OR DIAGNOSIS REQUIRED):   breast cancer screening    Preferred imaging location?:   [facility]   DG Bone Density    Standing Status:   Future    Expected Date:   05/14/2025    Expiration Date:   12/02/2025    Reason for Exam (SYMPTOM  OR DIAGNOSIS REQUIRED):   osteoporosis    Preferred imaging location?:   [facility]   In addition, I have reviewed and discussed with patient certain  preventive protocols, quality metrics, and best practice recommendations. A written personalized care plan for preventive services as well as general preventive health recommendations were provided to patient.   Lolita Libra, CMA   12/02/2024   Return in 1 year (on 12/02/2025).  After Visit Summary: (Mail) Due to this being a telephonic visit, the after visit summary with patients personalized plan was offered to patient via mail   Nurse Notes: HM Addressed: Mammogram ordered DEXA ordered   "

## 2024-12-02 NOTE — Patient Instructions (Addendum)
 Kimberly Valenzuela,  Thank you for taking the time for your Medicare Wellness Visit. I appreciate your continued commitment to your health goals. Please review the care plan we discussed, and feel free to reach out if I can assist you further.  Please note that Annual Wellness Visits do not include a physical exam. Some assessments may be limited, especially if the visit was conducted virtually. If needed, we may recommend an in-person follow-up with your provider.  Goal: To stop smoking  Ongoing Care Seeing your primary care provider every 3 to 6 months helps us  monitor your health and provide consistent, personalized care.   Dr Amon: 05/26/25  8:20am Medicare AWV: 12/03/25 9:40am  Referrals If a referral was made during today's visit and you haven't received any updates within two weeks, please contact the referred provider directly to check on the status.  Mamm[facility] [place]) due 08/03/24: [phone] Bone Density ([facility]) due 05/14/25:  [phone] [facility]:  [phone] Please call to schedule  [facility]:  [phone]. Please call to schedule an appointment.  Recommended Screenings:  Health Maintenance  Topic Date Due   Flu Shot  02/16/2025*   Breast Cancer Screening  08/03/2025   Screening for Lung Cancer  09/07/2025   Medicare Annual Wellness Visit  12/02/2025   Colon Cancer Screening  05/26/2028   DTaP/Tdap/Td vaccine (3 - Td or Tdap) 03/25/2031   Osteoporosis screening with Bone Density Scan  Completed   Hepatitis C Screening  Completed   Zoster (Shingles) Vaccine  Completed   Meningitis B Vaccine  Aged Out   Pneumococcal Vaccine for age over 76  Discontinued   COVID-19 Vaccine  Discontinued  *Topic was postponed. The date shown is not the original due date.       12/02/2024    9:50 AM  Advanced Directives  Does Patient Have a Medical Advance Directive? No  Would patient like information on creating a medical advance directive? Yes  (MAU/Ambulatory/Procedural Areas - Information given)  Please let me know if you do not receive your Advanced Directive Packet within 1 week. Once completed and notarized, you may return a copy to our office by either of the following. Bring a copy of your health care power of attorney and living will to the office to be added to your chart at your convenience. You can mail a copy to [facility] [address] [place], [place] [address] or email to [email or web address]   Vision: Annual vision screenings are recommended for early detection of glaucoma, cataracts, and diabetic retinopathy. These exams can also reveal signs of chronic conditions such as diabetes and high blood pressure.  Dental: Annual dental screenings help detect early signs of oral cancer, gum disease, and other conditions linked to overall health, including heart disease and diabetes.  Please see the attached documents for additional preventive care recommendations.

## 2024-12-17 ENCOUNTER — Other Ambulatory Visit: Payer: Self-pay | Admitting: Internal Medicine

## 2025-05-26 ENCOUNTER — Encounter: Admitting: Internal Medicine

## 2025-12-03 ENCOUNTER — Ambulatory Visit
